# Patient Record
Sex: Male | Born: 1945 | Race: White | Hispanic: No | Marital: Married | Smoking: Light tobacco smoker
Health system: Southern US, Community
[De-identification: ages and names within clinical notes are randomized; demographics above are authoritative.]

## PROBLEM LIST (undated history)

## (undated) DIAGNOSIS — I10 Essential (primary) hypertension: Secondary | ICD-10-CM

## (undated) DIAGNOSIS — E039 Hypothyroidism, unspecified: Secondary | ICD-10-CM

## (undated) DIAGNOSIS — N189 Chronic kidney disease, unspecified: Secondary | ICD-10-CM

## (undated) DIAGNOSIS — J189 Pneumonia, unspecified organism: Secondary | ICD-10-CM

## (undated) DIAGNOSIS — R0602 Shortness of breath: Secondary | ICD-10-CM

## (undated) DIAGNOSIS — I252 Old myocardial infarction: Secondary | ICD-10-CM

## (undated) DIAGNOSIS — I509 Heart failure, unspecified: Secondary | ICD-10-CM

## (undated) DIAGNOSIS — I2510 Atherosclerotic heart disease of native coronary artery without angina pectoris: Secondary | ICD-10-CM

## (undated) DIAGNOSIS — E785 Hyperlipidemia, unspecified: Secondary | ICD-10-CM

## (undated) DIAGNOSIS — K7460 Unspecified cirrhosis of liver: Secondary | ICD-10-CM

## (undated) DIAGNOSIS — I739 Peripheral vascular disease, unspecified: Secondary | ICD-10-CM

## (undated) HISTORY — DX: Chronic kidney disease, unspecified: N18.9

## (undated) HISTORY — DX: Heart failure, unspecified: I50.9

## (undated) HISTORY — DX: Unspecified cirrhosis of liver: K74.60

## (undated) HISTORY — PX: TONSILLECTOMY: SUR1361

---

## 2011-06-24 ENCOUNTER — Inpatient Hospital Stay: Payer: Self-pay | Admitting: Cardiology

## 2011-06-24 HISTORY — PX: CARDIAC CATHETERIZATION: SHX172

## 2011-06-24 LAB — BASIC METABOLIC PANEL
Anion Gap: 11 (ref 7–16)
Co2: 22 mmol/L (ref 21–32)
Creatinine: 1.08 mg/dL (ref 0.60–1.30)
EGFR (Non-African Amer.): >60
Glucose: 127 mg/dL — ABNORMAL HIGH (ref 65–99)
Sodium: 137 mmol/L (ref 136–145)

## 2011-06-24 LAB — CBC WITH DIFFERENTIAL/PLATELET
Basophil #: 0.1 10*3/uL (ref 0.0–0.1)
Basophil %: 1 %
Eosinophil #: 0.1 10*3/uL (ref 0.0–0.7)
Eosinophil %: 1.2 %
HCT: 51.5 % (ref 40.0–52.0)
Lymphocyte %: 23.6 %
MCH: 30.4 pg (ref 26.0–34.0)
Monocyte %: 11.4 %
Neutrophil #: 3.8 10*3/uL (ref 1.4–6.5)
Neutrophil %: 62.8 %
WBC: 6 10*3/uL (ref 3.8–10.6)

## 2011-06-24 LAB — PROTIME-INR: INR: 1.1

## 2011-06-24 LAB — CK TOTAL AND CKMB (NOT AT ARMC)
CK, Total: 53 U/L (ref 35–232)
CK-MB: 0.8 ng/mL (ref 0.5–3.6)

## 2011-06-24 LAB — APTT: Activated PTT: 37.9 secs — ABNORMAL HIGH (ref 23.6–35.9)

## 2011-06-25 LAB — CK TOTAL AND CKMB (NOT AT ARMC)
CK-MB: 0.7 ng/mL (ref 0.5–3.6)
CK-MB: 0.6 ng/mL (ref 0.5–3.6)

## 2011-06-25 LAB — LIPID PANEL
HDL Cholesterol: 26 mg/dL — ABNORMAL LOW (ref 40–60)
Ldl Cholesterol, Calc: 98 mg/dL (ref 0–100)
Triglycerides: 69 mg/dL (ref 0–200)
VLDL Cholesterol, Calc: 14 mg/dL (ref 5–40)

## 2011-06-25 LAB — TROPONIN I: Troponin-I: 0.06 ng/mL — ABNORMAL HIGH

## 2011-06-26 ENCOUNTER — Encounter (HOSPITAL_COMMUNITY): Payer: Self-pay | Admitting: Physician Assistant

## 2011-06-26 ENCOUNTER — Inpatient Hospital Stay (HOSPITAL_COMMUNITY)
Admission: AD | Admit: 2011-06-26 | Discharge: 2011-07-10 | DRG: 235 | Disposition: A | Discharge status: Skilled Nursing Facility | Payer: Medicare HMO | Source: Other Acute Inpatient Hospital | Attending: Thoracic Surgery (Cardiothoracic Vascular Surgery) | Admitting: Thoracic Surgery (Cardiothoracic Vascular Surgery)

## 2011-06-26 ENCOUNTER — Inpatient Hospital Stay (HOSPITAL_COMMUNITY): Payer: Medicare HMO

## 2011-06-26 ENCOUNTER — Other Ambulatory Visit: Payer: Self-pay | Admitting: Thoracic Surgery (Cardiothoracic Vascular Surgery)

## 2011-06-26 DIAGNOSIS — I255 Ischemic cardiomyopathy: Secondary | ICD-10-CM

## 2011-06-26 DIAGNOSIS — I739 Peripheral vascular disease, unspecified: Secondary | ICD-10-CM

## 2011-06-26 DIAGNOSIS — I2510 Atherosclerotic heart disease of native coronary artery without angina pectoris: Secondary | ICD-10-CM

## 2011-06-26 DIAGNOSIS — F17200 Nicotine dependence, unspecified, uncomplicated: Secondary | ICD-10-CM | POA: Diagnosis present

## 2011-06-26 DIAGNOSIS — E039 Hypothyroidism, unspecified: Secondary | ICD-10-CM

## 2011-06-26 DIAGNOSIS — Z720 Tobacco use: Secondary | ICD-10-CM

## 2011-06-26 DIAGNOSIS — I6529 Occlusion and stenosis of unspecified carotid artery: Secondary | ICD-10-CM | POA: Diagnosis present

## 2011-06-26 DIAGNOSIS — I5043 Acute on chronic combined systolic (congestive) and diastolic (congestive) heart failure: Secondary | ICD-10-CM | POA: Diagnosis present

## 2011-06-26 DIAGNOSIS — D62 Acute posthemorrhagic anemia: Secondary | ICD-10-CM | POA: Diagnosis not present

## 2011-06-26 DIAGNOSIS — I2589 Other forms of chronic ischemic heart disease: Secondary | ICD-10-CM | POA: Diagnosis present

## 2011-06-26 DIAGNOSIS — E785 Hyperlipidemia, unspecified: Secondary | ICD-10-CM

## 2011-06-26 DIAGNOSIS — I509 Heart failure, unspecified: Secondary | ICD-10-CM | POA: Diagnosis present

## 2011-06-26 DIAGNOSIS — R0602 Shortness of breath: Secondary | ICD-10-CM

## 2011-06-26 DIAGNOSIS — I10 Essential (primary) hypertension: Secondary | ICD-10-CM

## 2011-06-26 DIAGNOSIS — D696 Thrombocytopenia, unspecified: Secondary | ICD-10-CM | POA: Diagnosis present

## 2011-06-26 DIAGNOSIS — I252 Old myocardial infarction: Secondary | ICD-10-CM

## 2011-06-26 DIAGNOSIS — I779 Disorder of arteries and arterioles, unspecified: Secondary | ICD-10-CM

## 2011-06-26 DIAGNOSIS — N170 Acute kidney failure with tubular necrosis: Secondary | ICD-10-CM | POA: Diagnosis not present

## 2011-06-26 DIAGNOSIS — R5381 Other malaise: Secondary | ICD-10-CM | POA: Diagnosis not present

## 2011-06-26 HISTORY — DX: Hyperlipidemia, unspecified: E78.5

## 2011-06-26 HISTORY — DX: Shortness of breath: R06.02

## 2011-06-26 HISTORY — DX: Hypothyroidism, unspecified: E03.9

## 2011-06-26 HISTORY — DX: Atherosclerotic heart disease of native coronary artery without angina pectoris: I25.10

## 2011-06-26 HISTORY — DX: Old myocardial infarction: I25.2

## 2011-06-26 HISTORY — DX: Pneumonia, unspecified organism: J18.9

## 2011-06-26 HISTORY — DX: Peripheral vascular disease, unspecified: I73.9

## 2011-06-26 HISTORY — DX: Essential (primary) hypertension: I10

## 2011-06-26 LAB — CARDIAC PANEL(CRET KIN+CKTOT+MB+TROPI)
CK, MB: 2.4 ng/mL (ref 0.3–4.0)
CK, MB: 2.3 ng/mL (ref 0.3–4.0)
Total CK: 53 U/L (ref 7–232)
Troponin I: <0.3 ng/mL (ref <0.30)

## 2011-06-26 LAB — HEMOGLOBIN A1C: Mean Plasma Glucose: 123 mg/dL — ABNORMAL HIGH (ref <117)

## 2011-06-26 LAB — BASIC METABOLIC PANEL WITH GFR
Anion Gap: 12
BUN: 14 mg/dL
Calcium, Total: 8.1 mg/dL — ABNORMAL LOW
Chloride: 104 mmol/L
Co2: 21 mmol/L
Creatinine: 1.01 mg/dL
EGFR (African American): >60
EGFR (Non-African Amer.): >60
Glucose: 77 mg/dL
Osmolality: 273
Potassium: 4.1 mmol/L
Sodium: 137 mmol/L

## 2011-06-26 LAB — COMPREHENSIVE METABOLIC PANEL
AST: 19 U/L (ref 0–37)
BUN: 12 mg/dL (ref 6–23)
CO2: 24 mEq/L (ref 19–32)
Calcium: 9.3 mg/dL (ref 8.4–10.5)
Creatinine, Ser: 0.96 mg/dL (ref 0.50–1.35)
GFR calc Af Amer: >90 mL/min (ref >90)
GFR calc non Af Amer: 85 mL/min — ABNORMAL LOW (ref >90)

## 2011-06-26 LAB — HEPARIN LEVEL (UNFRACTIONATED): Heparin Unfractionated: 0.19 IU/mL — ABNORMAL LOW (ref 0.30–0.70)

## 2011-06-26 MED ORDER — LEVOTHYROXINE SODIUM 50 MCG PO TABS
50.0000 ug | ORAL_TABLET | Freq: Every day | ORAL | Status: DC
  Administered 2011-06-27 – 2011-06-29 (×3): 50 ug via ORAL
  Filled 2011-06-26 (×4): qty 1

## 2011-06-26 MED ORDER — LISINOPRIL 5 MG PO TABS
5.0000 mg | ORAL_TABLET | Freq: Every day | ORAL | Status: DC
  Administered 2011-06-27 – 2011-06-28 (×2): 5 mg via ORAL
  Filled 2011-06-26 (×3): qty 1

## 2011-06-26 MED ORDER — ONDANSETRON HCL 4 MG/2ML IJ SOLN: 4.0000 mg | Freq: Four times a day (QID) | INTRAMUSCULAR | Status: DC | PRN

## 2011-06-26 MED ORDER — FUROSEMIDE 40 MG PO TABS
40.0000 mg | ORAL_TABLET | Freq: Every day | ORAL | Status: DC
  Administered 2011-06-27 – 2011-06-28 (×2): 40 mg via ORAL
  Filled 2011-06-26 (×3): qty 1

## 2011-06-26 MED ORDER — SODIUM CHLORIDE 0.9 % IJ SOLN
3.0000 mL | Freq: Two times a day (BID) | INTRAMUSCULAR | Status: DC
  Administered 2011-06-27 – 2011-06-29 (×4): 3 mL via INTRAVENOUS

## 2011-06-26 MED ORDER — SODIUM CHLORIDE 0.9 % IV SOLN: 250.0000 mL | INTRAVENOUS | Status: DC | PRN

## 2011-06-26 MED ORDER — POTASSIUM CHLORIDE CRYS ER 20 MEQ PO TBCR
20.0000 meq | EXTENDED_RELEASE_TABLET | Freq: Every day | ORAL | Status: DC
  Administered 2011-06-27 – 2011-06-29 (×3): 20 meq via ORAL
  Filled 2011-06-26 (×4): qty 1

## 2011-06-26 MED ORDER — HEPARIN (PORCINE) IN NACL 100-0.45 UNIT/ML-% IJ SOLN
1400.0000 [IU]/h | INTRAMUSCULAR | Status: DC
  Administered 2011-06-28 – 2011-06-29 (×3): 1400 [IU]/h via INTRAVENOUS
  Filled 2011-06-26 (×5): qty 250

## 2011-06-26 MED ORDER — CARVEDILOL 6.25 MG PO TABS
6.2500 mg | ORAL_TABLET | Freq: Two times a day (BID) | ORAL | Status: DC
  Administered 2011-06-26 – 2011-06-27 (×3): 6.25 mg via ORAL
  Filled 2011-06-26 (×6): qty 1

## 2011-06-26 MED ORDER — ATORVASTATIN CALCIUM 20 MG PO TABS
20.0000 mg | ORAL_TABLET | Freq: Every day | ORAL | Status: DC
  Administered 2011-06-26 – 2011-07-09 (×13): 20 mg via ORAL
  Filled 2011-06-26 (×15): qty 1

## 2011-06-26 MED ORDER — FUROSEMIDE 40 MG PO TABS
40.0000 mg | ORAL_TABLET | Freq: Every day | ORAL | Status: DC
  Filled 2011-06-26: qty 1

## 2011-06-26 MED ORDER — ASPIRIN EC 81 MG PO TBEC
81.0000 mg | DELAYED_RELEASE_TABLET | Freq: Every day | ORAL | Status: DC
  Administered 2011-06-27 – 2011-06-29 (×3): 81 mg via ORAL
  Filled 2011-06-26 (×4): qty 1

## 2011-06-26 MED ORDER — SODIUM CHLORIDE 0.9 % IJ SOLN: 3.0000 mL | INTRAMUSCULAR | Status: DC | PRN

## 2011-06-26 MED ORDER — ALPRAZOLAM 0.25 MG PO TABS: 0.2500 mg | ORAL_TABLET | Freq: Two times a day (BID) | ORAL | Status: DC | PRN

## 2011-06-26 MED ORDER — POTASSIUM CHLORIDE CRYS ER 20 MEQ PO TBCR: 20.0000 meq | EXTENDED_RELEASE_TABLET | Freq: Every day | ORAL | Status: DC

## 2011-06-26 MED ORDER — NITROGLYCERIN 0.4 MG SL SUBL: 0.4000 mg | SUBLINGUAL_TABLET | SUBLINGUAL | Status: DC | PRN

## 2011-06-26 MED ORDER — ACETAMINOPHEN 325 MG PO TABS: 650.0000 mg | ORAL_TABLET | ORAL | Status: DC | PRN

## 2011-06-26 MED ORDER — ZOLPIDEM TARTRATE 5 MG PO TABS: 5.0000 mg | ORAL_TABLET | Freq: Every evening | ORAL | Status: DC | PRN

## 2011-06-26 NOTE — Consult Note (Signed)
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 Christopher McAlhany/ Dr. Kenneth Fath  [name] [name] is an 66 y.o. male.  HPI: 66 yo WM presents with cc/o shortness of breath and leg swelling.  [name] [name] has noted shortness of breath exertion for about 2 years. He is not a very precise historian.  He rarely sees a doctor, but had no known history of CAD or COPD. He states that when he first noted the SOB it would only be when he was walking up an incline or stairs. About 6-8 weeks ago he noted the SOB would occur with much less exertion and it would take him longer to recover. He finally sought medical attention when he began noticing swelling in his legs. He was started on medications for his blood pressure. He had a stress test which showed anteroapical and inferolateral defects (report not available, info obtained from notes). He then had cardiac catheterization 06/24/11. It showed severe left main and 3 vessel CAD and markedly impaired LV function with an EF of 20- 25%.  He was also found to have a 75- 95% left ICA stenosis by ultrasound. When asked about possible amaurosis fugax he says he has noted some visual difficulty, which usually occurs before breakfast. This does not sound like classic amaurosis, but it is difficult to tell for sure. He does have claudication with calf pain when he walks.  Past Medical History   Diagnosis  Date   .  Hyperlipidemia    .  Hypertension    Hypothyroidism  No past surgical history on file.  Family History   Problem  Relation  Age of Onset   .  Heart disease  Mother  59   .  Hypertension  Brother    .  Arrhythmia  Brother     Social History: reports that he has been smoking Cigarettes. He has a 50 pack-year smoking history. He does not have any smokeless tobacco history on file. He reports that he does not drink alcohol or use illicit drugs.  Allergies: No Known Allergies  Medications:    Prior to Admission:  Prescriptions prior to admission   Medication  Sig  Dispense  Refill   .  atorvastatin (LIPITOR) 20 MG tablet  Take 20 mg by mouth at bedtime.     .  carvedilol (COREG) 6.25 MG tablet  Take 6.25 mg by mouth 2 (two) times daily with a meal.     .  furosemide (LASIX) 40 MG tablet  Take 40 mg by mouth daily.     .  levothyroxine (SYNTHROID, LEVOTHROID) 50 MCG tablet  Take 50 mcg by mouth daily.     .  lisinopril (PRINIVIL,ZESTRIL) 5 MG tablet  Take 5 mg by mouth daily.     .  potassium chloride SA (K-DUR,KLOR-CON) 20 MEQ tablet  Take 20 mEq by mouth daily.      Results for orders placed during the hospital encounter of 06/26/11 (from the past 48 hour(s))   CARDIAC PANEL(CRET KIN+CKTOT+MB+TROPI) Status: Normal (Preliminary result)    Collection Time    06/26/11 4:00 PM   Component  Value  Range  Comment    Total CK  PENDING  7 - 232 (U/L)     CK, MB  2.4  0.3 - 4.0 (ng/mL)     Troponin I  <0.30  <0.30 (ng/mL)     Relative Index  PENDING  0.0 - 2.5       X-ray Chest Pa And Lateral  06/26/2011 *RADIOLOGY REPORT* Clinical Data: Shortness of breath, hypertension, smoker CHEST - 2 VIEW Comparison: None Findings: Enlargement of cardiac silhouette with pulmonary vascular congestion. Minimally tortuous thoracic aorta. No gross failure or consolidation. Tiny left pleural effusion. No pneumothorax or acute osseous findings. IMPRESSION: Enlargement of cardiac silhouette with pulmonary vascular congestion. Tiny left pleural effusion. No acute infiltrate. Original Report Authenticated By: MARK A. BOLES, M.D.   Review of Systems  Constitutional: Positive for malaise/fatigue. Negative for fever and chills.  Respiratory: Positive for cough and shortness of breath. Negative for hemoptysis and wheezing.  Cardiovascular: Positive for orthopnea and claudication. Negative for chest pain.  Gastrointestinal: Negative.  Genitourinary: Negative.  Musculoskeletal: Positive for myalgias.   Neurological:  Nonspecific visual changes while driving, does not seem localized to one eye  Endo/Heme/Allergies: Does not bruise/bleed easily.  All other systems reviewed and are negative.   Blood pressure 139/90, pulse 62, temperature 97.5 F (36.4 C), temperature source Oral, resp. rate 18, height 6' (1.829 m), weight 153 lb (69.4 kg), SpO2 100.00%.  Physical Exam  Vitals reviewed.  Constitutional: He is oriented to person, place, and time. He appears well-developed and well-nourished. No distress.  HENT:  Head: Normocephalic and atraumatic.  Eyes: EOM are normal. Pupils are equal, round, and reactive to light.  Neck: Neck supple. No thyromegaly present.  + carotid bruits bilaterally, right > left  Cardiovascular: Normal rate and regular rhythm. Exam reveals gallop (+ S4).  No murmur heard.  Respiratory: Effort normal. He has no wheezes. He has no rales.  Diminished BS bilaterally  GI: Soft. There is no tenderness.  Lymphadenopathy:  He has no cervical adenopathy.  Neurological: He is alert and oriented to person, place, and time. No cranial nerve deficit.  No focal motor deficits  Skin: Skin is warm and dry.   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 He also has acute on chronic systolic CHF. In addition he has a tight left ICA stenosis and severe iliac disease bilaterally.  CABG would be high risk in his case with a mortality in the 5- 10% range. He also would be at high risk for stroke or other ischemic complications given his diffuse vasculopathy.  I have discussed with the patient the general nature of the CABG, the need for general anesthesia, and incisions that are used. I have discussed the expected hospital stay,  overall recovery and short and long term outcomes. He understands the risks include but are not limited to death, stroke, MI, DVT/PE, bleeding, possible need for transfusion, infections, and other organ system dysfunction including respiratory, renal, or GI complications. He understands he would be a very high risk candidate.  It would be nice to assess myocardial viablity prior to CABG, will discuss feasability with Cardiology.  He will be seen by VVS re: his ICA stenoses  Cary Lothrop C  06/26/2011, 5:14 PM   

## 2011-06-26 NOTE — H&P (Signed)
History and Physical   Patient ID: [name] [name] MRN: [id number], DOB/AGE: Jun 12, 1945   Admit date: 06/26/2011 Date of Consult: 06/26/2011   Primary Physician: No primary provider on file. Primary Cardiologist: Dr. Harold Hedge; Dr. Odella Aquas  The patient's history and HPI was partially supplemented by paper records from [facility].  Pt. Profile: [name] [name] is a 66yo male from [place], [place] with no prior cardiac history, PHMx significant for hyperlipidemia, hypothyroidism who was transferred to [facility] hospital on 06/26/11 from [facility] for cardiothoracic surgery evaluation after underwent elective cardiac catheterization on 06/24/11 revealing severe three-vessel CAD.   Problem List: Past Medical History  Diagnosis Date  . Hyperlipidemia   . Hypertension     No past surgical history on file.   Allergies: No Known Allergies  PAST CARDIAC HISTORY:  CARDIAC CATH 06/24/11: Ostial left main: 80% stenosis; distal left main: 70% stenosis; proximal LAD: 95% stenosis; mid LAD: 95% stenosis; proximal LCx: 99% stenosis; proximal RCA: diffuse 90% stenosis; mid-RCA: 90% stenosis. LV-gram severe anterolateral hypokinesis, apical akinesis and moderate inferolateral hypokinesis. EF 25-30%.   Lexiscan Myoview: inferior and anteroapical ischemia; decreased EF (unquantified per [facility] notes)  HPI:   [name] underwent a Lexiscan Myoview after endorsing increased DOE. Per report, the study elicited evidence of inferior and anteroapical ischemia consistent with a possible ischemic cardiomyopathy. He was subsequently scheduled to undergo elective cardiac catheterization at [facility]. This was performed, and revealed severe three-vessel CAD (see above). He was subsequently admitted, placed on heparin gtt and stabilized prior to planned transfer to [facility] for evaluation of bypass grafting.   Additionally, carotid doppler u/s  revealed L ICA stenosis 75-95%. R ICA 50-75%. He was started on the below outpatient medications including ASA 81mg  daily. An additional discharge diagnosis of PVD was added prior to transfer. This was based on moderate to severe left iliac disease noted on cath. He was assessed, found to be stable, in good condition and transferred to [facility].   The patient reports experiencing leg and ankle swelling approximately 6-8 weeks ago. He saw a cardiologist and vascular specialist. He was placed placed on a diuretic and compression stockings. He was subsequently scheduled for a [employer]. This revealed multiple areas of hypokinesis, ischemia and decreased EF. He was was subsequently scheduled for a cardiac cath.   He states he has never experienced chest pain. He notes increased DOE for over a year. He states he becomes markedly short of breath after one flight of stairs. This has limited his activity level. He denies palpitations, lightheadedness, diaphoresis, n/v. Denies fevers, chills, n/v/d, cough. He denies urinary changes or changes to his bowel movements. Denies active bleeding. He notes occasional orthopnea. Worsening LE edema. Denies PND.   Heparin gtt was continued. The patient is currently comfortable, in NAD.   OSH Labs:  Lipid panel:  TC 138 TG 69 HDL 26 VLDL 14 LDL 98  Cardiac biomarkers: Trop-I: 0.07>0.07 CK-MB: 0.8>0.7 CK; 53>43  CBC: WBC: 6.0 Hgb 16.6 Hct: 51.5 PLT: 168  BMET:  BUN: 13 Cr: 1.08 Na: 137 K: 4.1 Cl: 104 CO2: 22 Ca: 8.5 GFR>60 Glu: 127  Home Medications: Prior to Admission medications   Medication Sig Start Date End Date Taking? Authorizing Provider  atorvastatin (LIPITOR) 20 MG tablet Take 20 mg by mouth at bedtime.   Yes Historical Provider, MD  carvedilol (COREG) 6.25 MG tablet Take 6.25 mg by mouth 2 (two) times daily with a meal.  Yes Historical Provider, MD  furosemide (LASIX) 40 MG tablet Take 40 mg by mouth daily.   Yes Historical  Provider, MD  levothyroxine (SYNTHROID, LEVOTHROID) 50 MCG tablet Take 50 mcg by mouth daily.   Yes Historical Provider, MD  lisinopril (PRINIVIL,ZESTRIL) 5 MG tablet Take 5 mg by mouth daily.   Yes Historical Provider, MD  potassium chloride SA (K-DUR,KLOR-CON) 20 MEQ tablet Take 20 mEq by mouth daily.   Yes Historical Provider, MD    Inpatient Medications:     Prescriptions prior to admission  Medication Sig Dispense Refill  . atorvastatin (LIPITOR) 20 MG tablet Take 20 mg by mouth at bedtime.      . carvedilol (COREG) 6.25 MG tablet Take 6.25 mg by mouth 2 (two) times daily with a meal.      . furosemide (LASIX) 40 MG tablet Take 40 mg by mouth daily.      Marland Kitchen levothyroxine (SYNTHROID, LEVOTHROID) 50 MCG tablet Take 50 mcg by mouth daily.      Marland Kitchen lisinopril (PRINIVIL,ZESTRIL) 5 MG tablet Take 5 mg by mouth daily.      . potassium chloride SA (K-DUR,KLOR-CON) 20 MEQ tablet Take 20 mEq by mouth daily.        Family History  Problem Relation Age of Onset  . Heart disease Mother 2  . Hypertension Brother   . Arrhythmia Brother      History   Social History  . Marital Status: Married    Spouse Name: N/A    Number of Children: 2  . Years of Education: N/A   Occupational History  . Not on file.   Social History Main Topics  . Smoking status: Current Everyday Smoker -- 1.0 packs/day for 50 years    Types: Cigarettes  . Smokeless tobacco: Not on file  . Alcohol Use: No  . Drug Use: No  . Sexually Active: Not on file   Other Topics Concern  . Not on file   Social History Narrative   Lives in [place], [place] alone.      Review of Systems: General:  negative for chills, fever, night sweats or weight changes.  Cardiovascular: positive for LE edema, DOE, orthopnea, shortness of breath, negative for chest pain, palpitations, paroxysmal nocturnal dyspnea  Dermatological: negative for rash Respiratory: negative for cough or wheezing Urologic: negative for hematuria Abdominal:  negative for nausea, vomiting, diarrhea, bright red blood per rectum, melena, or hematemesis Neurologic:  negative for visual changes, syncope, or dizziness All other systems reviewed and are otherwise negative except as noted above.  Physical Exam: Blood pressure 139/90, pulse 62, temperature 97.5 F (36.4 C), temperature source Oral, resp. rate 18, weight 69.4 kg (153 lb), SpO2 98.00%.    General: Well developed, well nourished, in no acute distress. Head: Normocephalic, atraumatic, sclera non-icteric, no xanthomas, nares are without discharge. Neck: Negative for carotid bruits. JVD not elevated. Lungs: Clear bilaterally to auscultation without wheezes, rales, or rhonchi. Breathing is unlabored. Heart: RRR with S1 S2. No murmurs, rubs, or gallops appreciated. Abdomen: Soft, non-tender, non-distended with normoactive bowel sounds. No hepatomegaly. No rebound/guarding. No obvious abdominal masses. Msk:  Strength and tone appears normal for age. Extremities: No clubbing, cyanosis or edema.  Distal pedal pulses are 2+ and equal bilaterally. Neuro: Alert and oriented X 3. Moves all extremities spontaneously. Psych:  Responds to questions appropriately with a normal affect.  Labs:  Pending  Radiology/Studies: No results found.  EKG:   ASSESSMENT AND PLAN:   Mr. Karbowski is  a 66yo male from [place], [place] with no prior cardiac history, PHMx significant for hyperlipidemia, hypothyroidism who was transferred to [facility] on 06/26/11 from [facility] for cardiothoracic surgery evaluation after underwent elective cardiac catheterization on 06/24/11 revealing severe three-vessel CAD.   1. CAD- noted in HPI. The patient has severe 3-vessel CAD including proximal 80% left main disease. He was transferred to [facility] for appropriate evaluation by cardiothoracic surgery for bypass grafting. He was sent on the above cardiac meds and heparin gtt. He is currently  comfortable.   - Admit to telemetry  - Cycle cardiac biomarkers  - TCTS and VVS consults have been made  - Daily BMET, CBC, EKGs  - Will order Hgb A1C  - Continue heparin gtt  - Add ASA 81mg  daily  - Continue BB/statin/NTG SL PRN  - Further recs and management TBD by TCTS and VVS   2. Ischemic cardiomyopathy- EF 25-30% on cath on 06/24/11. Likely ischemic given diffuse critical CAD and WMAs. Euvolemic on exam. No appreciable peripheral edema, JVD, rales/wheezes/rhonchi.   - Continue Lasix, KCl  - Daily weights, strict I/Os  - Heart healthy diet  - Will order CXR  3. Hyperlipidemia- noted in the past. Lipid panel from [facility] reveals LDL < 100.   - Continue statin  4. Hypertension- fairly well-controlled since transfer  - Continue antihypertensives  - Will continue to monitor and uptitrate BB if further BP control needed   5. Carotid artery disease- L>R ICA stenosis as noted in the HPI. Patient denies incoordination, slurred speech, weakness, numbness.   - VVS consult made   6. Tobacco abuse- 50+ pack-year history. Patient willing to quit with assistance.   - Tobacco cessation counseling  7. Hypothyroidism- diagnosed by PCP  - Continue Synthroid  Signed, R. Hurman Horn, PA-C 06/26/2011, 3:11 PM    I have personally seen and examined this patient with Hurman Horn, PA-C. I agree with the assessment and plan as outlined above. Mr. Loiseau is transferred from [facility] under the care of Dr. Lady Gary (Cardiology) who has performed his complete workup. [facility] has not been involved at [facility]. We are asked to assume his care until he can be evaluated by CT surgery and Vascular Surgery. He has been found to have severe triple vessel CAD with left main disease and also high grade LICA stenosis. We will continue current management. We have called CT surgery and VVS to see the pt to plan surgical intervention.   Darling Cieslewicz 4:07 PM 06/26/2011

## 2011-06-26 NOTE — Progress Notes (Addendum)
ANTICOAGULATION CONSULT NOTE - Initial Consult  Pharmacy Consult for Heparin Indication: chest pain/ACS (3 vessel disease)  No Known Allergies  Patient Measurements: Height: 6' (182.9 cm) Weight: 153 lb (69.4 kg) IBW/kg (Calculated) : 77.6  Heparin Dosing Weight: 69.4 kg  Vital Signs: Temp: 97.5 F (36.4 C) (06/07 1400) Temp src: Oral (06/07 1400) BP: 139/90 mmHg (06/07 1400) Pulse Rate: 62  (06/07 1400)  Labs: From [place]  Hgb 16.6, PLTC 168 SCr 1.08, CrCl ~ 60 ml/min  aPTT (on heparin) 69.4    Medical History: Past Medical History  Diagnosis Date  . Hyperlipidemia   . Hypertension     Medications:  Heparin currently infusing at 1040 units/hr.  Pt has been on this rate since 6/5 at [place]  Assessment: 66 yo M transferred to [facility] from  for TCTS eval. Pt had cardiac cath at [facility] 06/24/11 which revealed 3vd  Goal of Therapy:  Heparin level 0.3-0.7 units/ml Monitor platelets by anticoagulation protocol: Yes   Plan:  Confirmed with patient, heparin infusion has continued during transport.  Will check heparin level now and adjust rate if needed.  [employer], Pharm.D., BCPS Clinical Pharmacist Pager [phone] 06/26/2011 4:59 PM   Addendum: Heparin level = 0.19 on 1040 units/hr.  Level is below goal of 0.3-0.7.  Will increase heparin infusion rate to 1300 units/hr.  Will follow up with repeat heparin level in 6 hours.  Will also order daily heparin level and CBC to start 06/27/11.  [employer], Pharm.D., BCPS Clinical Pharmacist Pager [phone] 06/26/2011 6:52 PM

## 2011-06-27 DIAGNOSIS — Z01810 Encounter for preprocedural cardiovascular examination: Secondary | ICD-10-CM

## 2011-06-27 LAB — BASIC METABOLIC PANEL
CO2: 22 mEq/L (ref 19–32)
Chloride: 101 mEq/L (ref 96–112)
Creatinine, Ser: 0.99 mg/dL (ref 0.50–1.35)
Glucose, Bld: 82 mg/dL (ref 70–99)
Sodium: 134 mEq/L — ABNORMAL LOW (ref 135–145)

## 2011-06-27 LAB — CBC
HCT: 42.8 % (ref 39.0–52.0)
MCH: 30.8 pg (ref 26.0–34.0)
MCV: 88.4 fL (ref 78.0–100.0)
RBC: 4.84 MIL/uL (ref 4.22–5.81)
WBC: 6.7 10*3/uL (ref 4.0–10.5)

## 2011-06-27 LAB — CARDIAC PANEL(CRET KIN+CKTOT+MB+TROPI): Total CK: 43 U/L (ref 7–232)

## 2011-06-27 NOTE — Progress Notes (Addendum)
Pre-op Cardiac Surgery  Carotid Findings:   There is evidence bilaterally of internal carotid artery stenosis in the 60-79% range. There is right external carotid artery stenosis, and the left external carotid artery has an atypical waveform. Unable to visualize the right vertebral artery, left vertebral artery demonstrates antegrade flow.   Upper Extremity Right Left  Brachial Pressures 151-Biphasic 151-Monophasic  Radial Waveforms Dampened monophasic Dampened monophasic  Ulnar Waveforms Dampened monophasic Monophasic  Palmar Arch (Allen's Test) Within normal limits Signal obliterates with radial compression, is unaffected by ulnar compression.      Lower  Extremity Right Left  Dorsalis Pedis Unable to insonate Unable to insonate  Anterior Tibial    Posterior Tibial 71-Severely dampened monophasic 56-Severely dampened monophasic  Ankle/Brachial Indices 0.47 0.37     Findings:  Right ABI=0.47, left ABI=0.37, which is indicative of severe disease bilaterally. Refer to tables above for additional data.  06/28/2011 11:21 AM Elpidio Galea, RDMS, RDCS

## 2011-06-27 NOTE — Progress Notes (Signed)
  Patient Name: [name] [name]      SUBJECTIVE: Admitted in transfer [facility] following catheterization demonstrating severe left main disease EF of 20-25%. He also has high-grade left ICA stenosis. Surgical consultation was obtained and plans not clear.  No chest pain  O iV heparin  VVS consult pending  Viability testing recommended    Past Medical History  Diagnosis Date  . Hyperlipidemia   . Hypertension   . Coronary artery disease   . Peripheral vascular disease 06/26/11    "blockages"  . 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 Lady Gary thinks I've had 2 that I didn't know about"  . Shortness of breath 06/26/11    "all the time for a good while"  . Pneumonia 1950-1960's  . Hypothyroidism     PHYSICAL EXAM Filed Vitals:   06/26/11 1400 06/26/11 1603 06/26/11 2100 06/27/11 0500  BP: 139/90  156/66 166/82  Pulse: 62  51 52  Temp: 97.5 F (36.4 C)  97.9 F (36.6 C) 97.5 F (36.4 C)  TempSrc: Oral     Resp: 18  18 20   Height:      Weight:    168 lb 1.6 oz (76.25 kg)  SpO2: 98% 100% 97% 96%   Well developed and nourished in no acute distress HENT normal Neck supple with JVP-flat Bilateral right greater than left carotid bruit Clear Regular rate and rhythm, no murmurs or gallops Abd-soft with active BS No Clubbing cyanosis edema Skin-warm and dry A & Oriented  Grossly normal sensory and motor function   TELEMETRY: Reviewed telemetry pt in nsr  No intake or output data in the 24 hours ending 06/27/11 0849  LABS: Basic Metabolic Panel:  Lab 06/27/11 4540 06/26/11 1711  NA 134* 134*  K 3.9 4.8  CL 101 100  CO2 22 24  GLUCOSE 82 79  BUN 11 12  CREATININE 0.99 0.96  CALCIUM 8.8 9.3  MG -- --  PHOS -- --   Cardiac Enzymes:  Basename 06/26/11 2132 06/26/11 1600  CKTOTAL 53 61  CKMB 2.3 2.4  CKMBINDEX -- --  TROPONINI <0.30 <0.30   CBC:  Lab 06/27/11 0340  WBC 6.7  NEUTROABS --  HGB 14.9  HCT 42.8  MCV 88.4  PLT 162   PROTIME: No  results found for this basename: LABPROT:3,INR:3 in the last 72 hours Liver Function Tests:  Basename 06/26/11 1711  AST 19  ALT 15  ALKPHOS 203*  BILITOT 1.4*  PROT 7.1  ALBUMIN 3.4*   No results found for this basename: LIPASE:2,AMYLASE:2 in the last 72 hours BNP: No components found with this basename: POCBNP:3 D-Dimer: No results found for this basename: DDIMER:2 in the last 72 hours Hemoglobin A1C:  Basename 06/26/11 1711  HGBA1C 5.9*     ASSESSMENT AND PLAN:  Patient Active Hospital Problem List: CAD (coronary artery disease) (06/26/2011)   Ischemic cardiomyopathy (06/26/2011)   Hyperlipidemia (06/26/2011)   Hypertension (06/26/2011)   Carotid artery disease (06/26/2011)   Tobacco abuse (06/26/2011)   Hypothyroidism (06/26/2011)    Continue current medications Vascular surgery consultation pending Viability assessment-will discuss with CT surgery as to whether they would like Thallium or MR   Signed, Sherryl Manges MD  06/27/2011

## 2011-06-27 NOTE — Progress Notes (Signed)
[facility]  Waiting for carotid duplexes to upload to reading system before leaving my opinion.  Appears B ICA stenosis < 80% so likely nothing immediately needed.  Leonides Sake, MD [facility] [place] Office: [phone] Pager: [phone]  06/27/2011, 9:39 PM

## 2011-06-27 NOTE — Progress Notes (Addendum)
ANTICOAGULATION CONSULT NOTE - Follow Up Consult  Pharmacy Consult for heparin Indication: chest pain/ACS  Labs:  Basename 06/27/11 0340 06/26/11 2132 06/26/11 1711 06/26/11 1600  HGB 14.9 -- -- --  HCT 42.8 -- -- --  PLT 162 -- -- --  APTT -- -- -- --  LABPROT -- -- -- --  INR -- -- -- --  HEPARINUNFRC 0.32 -- 0.19* --  CREATININE -- -- 0.96 --  CKTOTAL -- 53 -- 61  CKMB -- 2.3 -- 2.4  TROPONINI -- <0.30 -- <0.30    Assessment/Plan: 66yo male now therapeutic on heparin after rate increase.  Will increase heparin to 1400 units / hr Follow up AM heparin level  Okey Regal, PharmD Clinical Pharmacist [phone] 06/27/2011,4:44 AM

## 2011-06-27 NOTE — Progress Notes (Signed)
No complaints this AM  BP 166/82  Pulse 52  Temp(Src) 97.5 F (36.4 C) (Oral)  Resp 20  Ht 6' (1.829 m)  Wt 168 lb 1.6 oz (76.25 kg)  BMI 22.80 kg/m2  SpO2 96%  Cardiac RRR Lungs diminished BS bilaterally  D/W Dr. Graciela Husbands- he's scheduled for MR on MOnday Plan CABG Tuesday assuming MR shows viability VVS evaluation pending

## 2011-06-28 ENCOUNTER — Inpatient Hospital Stay (HOSPITAL_COMMUNITY): Payer: Medicare HMO

## 2011-06-28 DIAGNOSIS — I6529 Occlusion and stenosis of unspecified carotid artery: Secondary | ICD-10-CM

## 2011-06-28 LAB — BASIC METABOLIC PANEL
BUN: 12 mg/dL (ref 6–23)
CO2: 21 mEq/L (ref 19–32)
Chloride: 102 mEq/L (ref 96–112)
Glucose, Bld: 78 mg/dL (ref 70–99)
Potassium: 4.2 mEq/L (ref 3.5–5.1)

## 2011-06-28 LAB — CBC
HCT: 44.2 % (ref 39.0–52.0)
Hemoglobin: 15.3 g/dL (ref 13.0–17.0)
MCH: 30.8 pg (ref 26.0–34.0)
MCHC: 34.6 g/dL (ref 30.0–36.0)
MCV: 88.9 fL (ref 78.0–100.0)

## 2011-06-28 MED ORDER — IOHEXOL 350 MG/ML SOLN
50.0000 mL | Freq: Once | INTRAVENOUS | Status: AC | PRN
  Administered 2011-06-28: 50 mL via INTRAVENOUS

## 2011-06-28 MED ORDER — CARVEDILOL 12.5 MG PO TABS
12.5000 mg | ORAL_TABLET | Freq: Two times a day (BID) | ORAL | Status: DC
  Administered 2011-06-28 – 2011-06-29 (×3): 12.5 mg via ORAL
  Filled 2011-06-28 (×6): qty 1

## 2011-06-28 NOTE — Progress Notes (Signed)
Patient Name: [name] [name]      SUBJECTIVE: Admitted in transfer [facility] following catheterization demonstrating severe left main disease EF of 20-25%. He also has high-grade left ICA stenosis. Surgical consultation was obtained and plans not clear.  No chest pain  O iV heparin  VVS consult pending  Viability testing >> MRI ordered    Past Medical History  Diagnosis Date  . Hyperlipidemia   . Hypertension   . Coronary artery disease   . Peripheral vascular disease 06/26/11    "blockages"  . 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 Lady Gary thinks I've had 2 that I didn't know about"  . Shortness of breath 06/26/11    "all the time for a good while"  . Pneumonia 1950-1960's  . Hypothyroidism     PHYSICAL EXAM Filed Vitals:   06/27/11 1025 06/27/11 1404 06/27/11 2100 06/28/11 0500  BP: 128/88 139/83 144/86 134/77  Pulse:  58 55 53  Temp:  98.1 F (36.7 C) 98.6 F (37 C) 98 F (36.7 C)  TempSrc:  Oral    Resp:  20 18 18   Height:      Weight:    170 lb 3.1 oz (77.2 kg)  SpO2:  97% 100% 99%   Well developed and nourished in no acute distress HENT normal Neck supple with JVP-flat Bilateral right greater than left carotid bruit Clear Regular rate and rhythm, no murmurs or gallops Abd-soft with active BS No Clubbing cyanosis edema Skin-warm and dry A & Oriented  Grossly normal sensory and motor function   TELEMETRY: Reviewed telemetry pt in nsr   Intake/Output Summary (Last 24 hours) at 06/28/11 0755 Last data filed at 06/28/11 0300  Gross per 24 hour  Intake 1253.85 ml  Output      0 ml  Net 1253.85 ml    LABS: Basic Metabolic Panel:  Lab 06/27/11 4098 06/26/11 1711  NA 134* 134*  K 3.9 4.8  CL 101 100  CO2 22 24  GLUCOSE 82 79  BUN 11 12  CREATININE 0.99 0.96  CALCIUM 8.8 9.3  MG -- --  PHOS -- --   Cardiac Enzymes:  Basename 06/27/11 0852 06/26/11 2132 06/26/11 1600  CKTOTAL 43 53 61  CKMB 2.0 2.3 2.4  CKMBINDEX -- -- --  TROPONINI  <0.30 <0.30 <0.30   CBC:  Lab 06/28/11 0620 06/27/11 0340  WBC 6.5 6.7  NEUTROABS -- --  HGB 15.3 14.9  HCT 44.2 42.8  MCV 88.9 88.4  PLT 170 162   PROTIME: No results found for this basename: LABPROT:3,INR:3 in the last 72 hours Liver Function Tests:  Basename 06/26/11 1711  AST 19  ALT 15  ALKPHOS 203*  BILITOT 1.4*  PROT 7.1  ALBUMIN 3.4*   No results found for this basename: LIPASE:2,AMYLASE:2 in the last 72 hours BNP: No components found with this basename: POCBNP:3 D-Dimer: No results found for this basename: DDIMER:2 in the last 72 hours Hemoglobin A1C:  Basename 06/26/11 1711  HGBA1C 5.9*     ASSESSMENT AND PLAN:  Patient Active Hospital Problem List: CAD (coronary artery disease) (06/26/2011)   Ischemic cardiomyopathy (06/26/2011)   Hyperlipidemia (06/26/2011)   Hypertension (06/26/2011)   Carotid artery disease (06/26/2011)   Tobacco abuse (06/26/2011)   Hypothyroidism (06/26/2011)    Continue current medications, BB and ACE Vascular surgery consultation pending>> ICA stenosis perhaps not so bad Viability assessment-  MR pending Will increase coreg with elevated blood pressure   Signed, Sherryl Manges MD  06/28/2011   

## 2011-06-28 NOTE — Progress Notes (Signed)
ANTICOAGULATION CONSULT NOTE - Follow Up Consult  Pharmacy Consult for heparin Indication: chest pain/ACS  Labs:  Basename 06/28/11 0620 06/27/11 0852 06/27/11 0340 06/26/11 2132 06/26/11 1711 06/26/11 1600  HGB 15.3 -- 14.9 -- -- --  HCT 44.2 -- 42.8 -- -- --  PLT 170 -- 162 -- -- --  APTT -- -- -- -- -- --  LABPROT -- -- -- -- -- --  INR -- -- -- -- -- --  HEPARINUNFRC 0.42 -- 0.32 -- 0.19* --  CREATININE 1.01 -- 0.99 -- 0.96 --  CKTOTAL -- 43 -- 53 -- 61  CKMB -- 2.0 -- 2.3 -- 2.4  TROPONINI -- <0.30 -- <0.30 -- <0.30    Assessment/Plan: CABG Tuesday Heparin level therapeutic  1) Continue heparin at 1400 units / hr 2) Follow AM level  Okey Regal, PharmD Clinical Pharmacist [phone] 06/28/2011,9:17 AM

## 2011-06-28 NOTE — Consult Note (Addendum)
[facility] [place]  New Carotid Patient  Referred by:  Dr. Clifton Madden  Reason for referral: L carotid stenosis  History of Present Illness  [name] [name] is a 66 y.o. (09/05/1945) male who presents with chief complaint: heart disease.  This pt was transferred from outside hospital with 3 vessel coronary artery disease.  By report he has a high grade stenosis in his LICA.  He has a distant history which may be consistent with prior stroke on L: L facial numbness and expressive aphasia which resolved after a few weeks.  The patient has never had amaurosis fugax or monocular blindness.  The patient has never had facial drooping or hemiplegia.  The patient has had receptive or expressive aphasia as noted.   The patient's previous neurologic deficits have resolved.  The patient's risks factors for carotid disease include: HTN, hyperlipdemia, and former smoker.  Past Medical History  Diagnosis Date  . Hyperlipidemia   . Hypertension   . Coronary artery disease   . Peripheral vascular disease 06/26/11    "blockages"  . 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 Lady Gary thinks I've had 2 that I didn't know about"  . Shortness of breath 06/26/11    "all the time for a good while"  . Pneumonia 1950-1960's  . Hypothyroidism     Past Surgical History  Procedure Date  . Cardiac catheterization 06/24/11  . Tonsillectomy     "as a kid"    History   Social History  . Marital Status: Married    Spouse Name: N/A    Number of Children: 2  . Years of Education: N/A   Occupational History  . Not on file.   Social History Main Topics  . Smoking status: Former Smoker -- 1.0 packs/day for 50 years    Types: Cigarettes    Quit date: 06/23/2011  . Smokeless tobacco: Not on file  . Alcohol Use: Yes     06/26/11 "every once in awhile I'd drink a beer; that's about come to an end too"  . Drug Use: No  . Sexually Active: Not Currently   Other Topics Concern  . Not on file    Social History Narrative   Lives in [place], [place] alone.     Family History  Problem Relation Age of Onset  . Heart disease Mother 19  . Hypertension Brother   . Arrhythmia Brother     No current facility-administered medications on file prior to encounter.   Current Outpatient Prescriptions on File Prior to Encounter  Medication Sig Dispense Refill  . atorvastatin (LIPITOR) 20 MG tablet Take 20 mg by mouth at bedtime.      . carvedilol (COREG) 6.25 MG tablet Take 6.25 mg by mouth 2 (two) times daily with a meal.      . furosemide (LASIX) 40 MG tablet Take 40 mg by mouth daily.      Marland Kitchen levothyroxine (SYNTHROID, LEVOTHROID) 50 MCG tablet Take 50 mcg by mouth daily.      Marland Kitchen lisinopril (PRINIVIL,ZESTRIL) 5 MG tablet Take 5 mg by mouth daily.      . potassium chloride SA (K-DUR,KLOR-CON) 20 MEQ tablet Take 20 mEq by mouth daily.        No Known Allergies  REVIEW OF SYSTEMS:  (Positives checked otherwise negative)  CARDIOVASCULAR: [ ]  chest pain    [ ]  chest pressure    [ ]  palpitations    [x]  orthopnea   [x]  dyspnea on  exert. [ ]  claudication    [ ]  rest pain     [ ]  DVT     [x]  edema  PULMONARY:    [ ]  productive cough [ ]  asthma  [ ]  wheezing  NEUROLOGIC:    [ ]  weakness    [ ]  paresthesias   [ ]  aphasia    [ ]  amaurosis    [ ]  dizziness  HEMATOLOGIC:    [ ]  bleeding problems  [ ]  clotting disorders  MUSCULOSKEL: [ ]  joint pain     [ ]  joint swelling  GASTROINTEST:  [ ]   blood in stool   [ ]   hematemesis  GENITOURINARY:   [ ]   dysuria    [ ]   hematuria  PSYCHIATRIC:   [ ]  history of major depression  INTEGUMENTARY: [ ]  rashes    [ ]  ulcers  CONSTITUTIONAL:  [ ]  fever     [ ]  chills  Physical Examination  Filed Vitals:   06/27/11 1025 06/27/11 1404 06/27/11 2100 06/28/11 0500  BP: 128/88 139/83 144/86 134/77  Pulse:  58 55 53  Temp:  98.1 F (36.7 C) 98.6 F (37 C) 98 F (36.7 C)  TempSrc:  Oral    Resp:  20 18 18   Height:      Weight:    170 lb 3.1 oz (77.2  kg)  SpO2:  97% 100% 99%   Body mass index is 23.08 kg/(m^2).  General: A&O x 3, WDWN  Head: [place]/AT  Ear/Nose/Throat: Hearing grossly intact, nares w/o erythema or drainage, oropharynx w/o Erythema/Exudate  Eyes: PERRLA, EOMI  Neck: Supple, no nuchal rigidity, no palpable LAD  Pulmonary: Sym exp, good air movt, CTAB, no rales, rhonchi, & wheezing  Cardiac: RRR, Nl S1, S2, no Murmurs, rubs or gallops  Vascular: Vessel Right Left  Radial Palpable Palpable  Brachial Palpable Palpable  Carotid Palpable, without bruit Palpable, without bruit  Aorta Non-palpable N/A  Femoral Palpable Palpable  Popliteal Non-palpable Non-palpable  PT Non-Palpable Non-Palpable  DP Non-Palpable Non-Palpable   Gastrointestinal: soft, NTND, -G/R, - HSM, - masses, - CVAT B  Musculoskeletal: M/S 5/5 throughout , Extremities without ischemic changes   Neurologic: CN 2-12 intact , Pain and light touch intact in extremities , Motor exam as listed above  Psychiatric: Judgment intact, Mood & affect appropriate for pt's clinical situation  Dermatologic: See M/S exam for extremity exam, no rashes otherwise noted  Lymph : No Cervical, Axillary, or Inguinal lymphadenopathy   Laboratory: CBC:    Component Value Date/Time   WBC 6.5 06/28/2011 0620   RBC 4.97 06/28/2011 0620   HGB 15.3 06/28/2011 0620   HCT 44.2 06/28/2011 0620   PLT 170 06/28/2011 0620   MCV 88.9 06/28/2011 0620   MCH 30.8 06/28/2011 0620   MCHC 34.6 06/28/2011 0620   RDW 15.9* 06/28/2011 0620    BMP:    Component Value Date/Time   NA 134* 06/28/2011 0620   K 4.2 06/28/2011 0620   CL 102 06/28/2011 0620   CO2 21 06/28/2011 0620   GLUCOSE 78 06/28/2011 0620   BUN 12 06/28/2011 0620   CREATININE 1.01 06/28/2011 0620   CALCIUM 9.1 06/28/2011 0620   GFRNONAA 76* 06/28/2011 0620   GFRAA 88* 06/28/2011 0620    Coagulation: No results found for this basename: INR, PROTIME   No results found for this basename: PTT   X-ray Chest Pa And Lateral  06/26/2011   *RADIOLOGY REPORT*  Clinical Data: Shortness of breath,  hypertension, smoker  CHEST - 2 VIEW  Comparison: None  Findings: Enlargement of cardiac silhouette with pulmonary vascular congestion. Minimally tortuous thoracic aorta. No gross failure or consolidation. Tiny left pleural effusion. No pneumothorax or acute osseous findings.  IMPRESSION: Enlargement of cardiac silhouette with pulmonary vascular congestion. Tiny left pleural effusion. No acute infiltrate.  Original Report Authenticated By: Lollie Marrow, M.D.    Non-Invasive Vascular Imaging  CAROTID DUPLEX pending  Medical Decision Making  Shelvy Perazzo Stanbery is a 66 y.o. male who presents with: B ICA , history of high grade L ICA stenosis.   I am awaiting the carotid duplex upload so I make the final reads.  Preliminary results appears that both ICA to be in the 60-79% range.  Given there is a discrepancy between the two doppler studies, a CTA Neck is recommended as an additional study.  As long as the LICA stenosis is <80%, I don't recommend proceeding with a L CEA as this pt is asx.  I discussed in depth with the patient the nature of atherosclerosis, and emphasized the importance of maximal medical management including strict control of blood pressure, blood glucose, and lipid levels, obtaining regular exercise, antiplatelet agents, and cessation of smoking.  The patient is aware that without maximal medical management the underlying atherosclerotic disease process will progress, limiting the benefit of any interventions.  Thank you for allowing [place] to participate in this patient's care.  Leonides Sake, MD [facility] [place] Office: [phone] Pager: [phone]  06/28/2011, 8:30 AM   Addendum, BICA stenosis 60-79% on the mid to lower end of velocities.  Awaiting CTA Neck results.  Doubt a combined CABG-CEA will be needed.  Leonides Sake, MD [facility] [place] Office:  [phone] Pager: [phone]  06/28/2011, 2:49 PM

## 2011-06-28 NOTE — Progress Notes (Signed)
Pt routine am EKG read ACUTE MI/STEMI.  Pt denies any complaints.  There is no ST elevation present.  NT repositioned leads and redid EKG with no ACUTE MI reading present; it reads nonspecific st wave abnormality - no acute changes when compared with previous EKG.  Will continue to monitor.

## 2011-06-29 ENCOUNTER — Inpatient Hospital Stay (HOSPITAL_COMMUNITY): Payer: Medicare HMO

## 2011-06-29 DIAGNOSIS — I2510 Atherosclerotic heart disease of native coronary artery without angina pectoris: Principal | ICD-10-CM

## 2011-06-29 DIAGNOSIS — I2589 Other forms of chronic ischemic heart disease: Secondary | ICD-10-CM

## 2011-06-29 LAB — CBC
MCH: 30.3 pg (ref 26.0–34.0)
MCHC: 33.7 g/dL (ref 30.0–36.0)
MCV: 89.8 fL (ref 78.0–100.0)
Platelets: 166 10*3/uL (ref 150–400)
RBC: 4.92 MIL/uL (ref 4.22–5.81)

## 2011-06-29 LAB — BLOOD GAS, ARTERIAL
Acid-base deficit: 0.3 mmol/L (ref 0.0–2.0)
Bicarbonate: 23.6 mEq/L (ref 20.0–24.0)
Patient temperature: 98.6
TCO2: 24.8 mmol/L (ref 0–100)

## 2011-06-29 LAB — URINALYSIS, ROUTINE W REFLEX MICROSCOPIC
Bilirubin Urine: NEGATIVE
Glucose, UA: NEGATIVE mg/dL
Ketones, ur: NEGATIVE mg/dL
Leukocytes, UA: NEGATIVE
Specific Gravity, Urine: 1.02 (ref 1.005–1.030)
pH: 5 (ref 5.0–8.0)

## 2011-06-29 LAB — URINE MICROSCOPIC-ADD ON

## 2011-06-29 LAB — PULMONARY FUNCTION TEST

## 2011-06-29 LAB — BASIC METABOLIC PANEL
BUN: 13 mg/dL (ref 6–23)
CO2: 21 mEq/L (ref 19–32)
Calcium: 8.7 mg/dL (ref 8.4–10.5)
Creatinine, Ser: 1.03 mg/dL (ref 0.50–1.35)
Glucose, Bld: 93 mg/dL (ref 70–99)
Sodium: 133 mEq/L — ABNORMAL LOW (ref 135–145)

## 2011-06-29 MED ORDER — POTASSIUM CHLORIDE 2 MEQ/ML IV SOLN
80.0000 meq | INTRAVENOUS | Status: DC
  Filled 2011-06-29: qty 40

## 2011-06-29 MED ORDER — SODIUM CHLORIDE 0.9 % IV SOLN
0.1000 ug/kg/h | INTRAVENOUS | Status: DC
  Filled 2011-06-29: qty 4

## 2011-06-29 MED ORDER — INSULIN REGULAR HUMAN 100 UNIT/ML IJ SOLN
INTRAMUSCULAR | Status: DC
  Filled 2011-06-29: qty 1

## 2011-06-29 MED ORDER — TRANEXAMIC ACID 100 MG/ML IV SOLN
1.5000 mg/kg/h | INTRAVENOUS | Status: DC
  Filled 2011-06-29: qty 25

## 2011-06-29 MED ORDER — CEFUROXIME SODIUM 750 MG IJ SOLR
750.0000 mg | INTRAMUSCULAR | Status: DC
  Filled 2011-06-29: qty 750

## 2011-06-29 MED ORDER — DEXTROSE 5 % IV SOLN
1.5000 g | INTRAVENOUS | Status: AC
  Administered 2011-06-30: .75 g via INTRAVENOUS
  Administered 2011-06-30: 1.5 g via INTRAVENOUS
  Filled 2011-06-29: qty 1.5

## 2011-06-29 MED ORDER — EPINEPHRINE HCL 1 MG/ML IJ SOLN
0.5000 ug/min | INTRAVENOUS | Status: DC
  Filled 2011-06-29: qty 4

## 2011-06-29 MED ORDER — SODIUM BICARBONATE 8.4 % IV SOLN
INTRAVENOUS | Status: AC
  Administered 2011-06-30: 10:00:00
  Filled 2011-06-29: qty 2.5

## 2011-06-29 MED ORDER — BISACODYL 5 MG PO TBEC
5.0000 mg | DELAYED_RELEASE_TABLET | Freq: Once | ORAL | Status: DC
  Filled 2011-06-29: qty 1

## 2011-06-29 MED ORDER — CHLORHEXIDINE GLUCONATE 4 % EX LIQD
60.0000 mL | Freq: Once | CUTANEOUS | Status: AC
  Administered 2011-06-30: 4 via TOPICAL
  Filled 2011-06-29: qty 60

## 2011-06-29 MED ORDER — DEXTROSE 5 % IV SOLN
30.0000 ug/min | INTRAVENOUS | Status: DC
  Filled 2011-06-29: qty 2

## 2011-06-29 MED ORDER — NITROGLYCERIN IN D5W 200-5 MCG/ML-% IV SOLN
2.0000 ug/min | INTRAVENOUS | Status: DC
  Filled 2011-06-29: qty 250

## 2011-06-29 MED ORDER — CHLORHEXIDINE GLUCONATE 4 % EX LIQD
Freq: Once | CUTANEOUS | Status: AC
  Administered 2011-06-29: 21:00:00 via TOPICAL

## 2011-06-29 MED ORDER — VANCOMYCIN HCL 1000 MG IV SOLR
1250.0000 mg | INTRAVENOUS | Status: AC
  Administered 2011-06-30: 1250 mg via INTRAVENOUS
  Filled 2011-06-29: qty 1250

## 2011-06-29 MED ORDER — FUROSEMIDE 40 MG PO TABS
40.0000 mg | ORAL_TABLET | Freq: Two times a day (BID) | ORAL | Status: DC
  Administered 2011-06-29 (×2): 40 mg via ORAL
  Filled 2011-06-29 (×4): qty 1

## 2011-06-29 MED ORDER — TRANEXAMIC ACID (OHS) BOLUS VIA INFUSION
15.0000 mg/kg | INTRAVENOUS | Status: DC
  Filled 2011-06-29: qty 1143

## 2011-06-29 MED ORDER — TEMAZEPAM 15 MG PO CAPS: 15.0000 mg | ORAL_CAPSULE | Freq: Once | ORAL | Status: AC | PRN

## 2011-06-29 MED ORDER — GADOBENATE DIMEGLUMINE 529 MG/ML IV SOLN
20.0000 mL | Freq: Once | INTRAVENOUS | Status: AC
  Administered 2011-06-29: 20 mL via INTRAVENOUS

## 2011-06-29 MED ORDER — LISINOPRIL 10 MG PO TABS
10.0000 mg | ORAL_TABLET | Freq: Every day | ORAL | Status: DC
  Administered 2011-06-29: 10 mg via ORAL
  Filled 2011-06-29 (×2): qty 1

## 2011-06-29 MED ORDER — METOPROLOL TARTRATE 12.5 MG HALF TABLET
12.5000 mg | ORAL_TABLET | Freq: Once | ORAL | Status: DC
  Filled 2011-06-29: qty 1

## 2011-06-29 MED ORDER — MAGNESIUM SULFATE 50 % IJ SOLN
40.0000 meq | INTRAMUSCULAR | Status: DC
  Filled 2011-06-29: qty 10

## 2011-06-29 MED ORDER — DIAZEPAM 5 MG PO TABS
5.0000 mg | ORAL_TABLET | Freq: Once | ORAL | Status: AC
  Administered 2011-06-30: 5 mg via ORAL
  Filled 2011-06-29: qty 1

## 2011-06-29 MED ORDER — ALPRAZOLAM 0.25 MG PO TABS
0.2500 mg | ORAL_TABLET | ORAL | Status: DC | PRN
  Administered 2011-07-03 – 2011-07-09 (×2): 0.25 mg via ORAL
  Filled 2011-06-29 (×2): qty 1

## 2011-06-29 MED ORDER — DOPAMINE-DEXTROSE 3.2-5 MG/ML-% IV SOLN
2.0000 ug/kg/min | INTRAVENOUS | Status: DC
  Filled 2011-06-29: qty 250

## 2011-06-29 MED ORDER — ALBUTEROL SULFATE (5 MG/ML) 0.5% IN NEBU
2.5000 mg | INHALATION_SOLUTION | Freq: Once | RESPIRATORY_TRACT | Status: AC
  Administered 2011-06-29: 2.5 mg via RESPIRATORY_TRACT

## 2011-06-29 MED ORDER — TRANEXAMIC ACID (OHS) PUMP PRIME SOLUTION
2.0000 mg/kg | INTRAVENOUS | Status: DC
  Filled 2011-06-29: qty 1.52

## 2011-06-29 NOTE — Progress Notes (Signed)
PFT performed. Unconfirmed results will be placed in Progress Notes of [facility].

## 2011-06-29 NOTE — Consult Note (Signed)
Pharmacy Consult-Anticoagulation  66 y/o male currently on heparin protocol for a diagnosis of Chest Pain, ACS.  Current Labs: Hematology Lab Results  Component Value Date/Time   WBC 6.0 06/29/2011  5:35 AM   RBC 4.92 06/29/2011  5:35 AM   HGB 14.9 06/29/2011  5:35 AM   HCT 44.2 06/29/2011  5:35 AM   MCV 89.8 06/29/2011  5:35 AM   MCH 30.3 06/29/2011  5:35 AM   Platelets  Date Value Range Status  06/29/2011 166  150-400 (K/uL) Final   . Assessment  Heparin infusing at 1400 units/hr.  Heparin level therapeutic at 0.44. Goal Heparin level 0.3 - 0.7  WBC/Hgb/Hct/Plts:  6.0/14.9/44.2/166 (06/10 0535)  No bleeding noted.  Patient is stable with regards to anticoagulation.  Plan   Continue Heparin at current rate. Daily Heparin level and CBC while on Heparin.  Follow up CABG.  [name] [name], Elisha Headland, Pharm.D. 06/29/2011 9:42 AM

## 2011-06-29 NOTE — Progress Notes (Signed)
  Subjective: Feels well, no complaints today  Objective: Vital signs in last 24 hours: Temp:  [97.2 F (36.2 C)-97.6 F (36.4 C)] 97.6 F (36.4 C) (06/10 1437) Pulse Rate:  [51-57] 56  (06/10 1437) Cardiac Rhythm:  [-] Normal sinus rhythm;Heart block (06/10 0744) Resp:  [16-18] 16  (06/10 1437) BP: (138-184)/(48-80) 149/63 mmHg (06/10 1437) SpO2:  [96 %-99 %] 99 % (06/10 1437) Weight:  [167 lb 15.9 oz (76.2 kg)] 167 lb 15.9 oz (76.2 kg) (06/10 0500)  Hemodynamic parameters for last 24 hours:    Intake/Output from previous day: 06/09 0701 - 06/10 0700 In: 707.9 [P.O.:480; I.V.:227.9] Out: -  Intake/Output this shift: Total I/O In: 300 [P.O.:300] Out: -   General appearance: alert and no distress Heart: regular rate and rhythm Lungs: diminished breath sounds bibasilar Extremities: edema 1+  Lab Results:  Basename 06/29/11 0535 06/28/11 0620  WBC 6.0 6.5  HGB 14.9 15.3  HCT 44.2 44.2  PLT 166 170   BMET:  Basename 06/29/11 0535 06/28/11 0620  NA 133* 134*  K 3.7 4.2  CL 102 102  CO2 21 21  GLUCOSE 93 78  BUN 13 12  CREATININE 1.03 1.01  CALCIUM 8.7 9.1    PT/INR: No results found for this basename: LABPROT,INR in the last 72 hours ABG No results found for this basename: phart, pco2, po2, hco3, tco2, acidbasedef, o2sat   CBG (last 3)  No results found for this basename: GLUCAP:3 in the last 72 hours  Assessment/Plan: S/P   For CABG in AM Moderate stenoses BICA- appreciate Dr. Nicky Pugh assistance- no intervention needed at this time  MRI showed majority of compromised myocardium is potentially viable. Some full thickness scar, primarily at the apex. Based on these results CABG is best treatment option, albeit still relatively high risk.  I have discussed with the patient the general nature of the procedure, need for general anesthesia, and incisions to be used. I have discussed the expected hospital stay, overall recovery and short and long term  outcomes. He understands the risks include but are not limited to death, stroke, MI, DVT/PE, bleeding, possible need for transfusion, infections, and other organ system dysfunction including respiratory, renal, or GI complications. He accepts these risks and agrees to proceed.  For CABG in AM Orders writen D/C heparin gtt at 0400   LOS: 3 days    Hamdi Vari C 06/29/2011

## 2011-06-29 NOTE — Progress Notes (Signed)
[facility] [place]  Daily Progress Note  Assessment/Planning: Asx BICA stenosis ~70%   No surgical intervention needed, maximal medical management recommended  Follow up in the office [phone])  Thank you for the consultation!  Subjective    No complaints  Objective Filed Vitals:   06/28/11 1710 06/28/11 1900 06/28/11 2100 06/29/11 0500  BP: 184/80 138/48 148/58 167/52  Pulse: 57  53 51  Temp:   97.2 F (36.2 C) 97.4 F (36.3 C)  TempSrc:      Resp:   18 18  Height:      Weight:    167 lb 15.9 oz (76.2 kg)  SpO2:   98% 96%    Intake/Output Summary (Last 24 hours) at 06/29/11 1033 Last data filed at 06/28/11 1904  Gross per 24 hour  Intake 704.93 ml  Output      0 ml  Net 704.93 ml    PULM  CTAB CV  RRR GI  soft, NTND NEURO exam unchanged, completely intact  Laboratory CBC    Component Value Date/Time   WBC 6.0 06/29/2011 0535   HGB 14.9 06/29/2011 0535   HCT 44.2 06/29/2011 0535   PLT 166 06/29/2011 0535    BMET    Component Value Date/Time   NA 133* 06/29/2011 0535   K 3.7 06/29/2011 0535   CL 102 06/29/2011 0535   CO2 21 06/29/2011 0535   GLUCOSE 93 06/29/2011 0535   BUN 13 06/29/2011 0535   CREATININE 1.03 06/29/2011 0535   CALCIUM 8.7 06/29/2011 0535   GFRNONAA 74* 06/29/2011 0535   GFRAA 86* 06/29/2011 0535   Ct Angio Neck W/cm &/or Wo/cm  06/28/2011   *RADIOLOGY REPORT*  Clinical Data:  Possible high-grade stenosis left internal carotid artery.  CT ANGIOGRAPHY NECK  Technique:  Multidetector CT imaging of the neck was performed using the standard protocol during bolus administration of intravenous contrast.  Multiplanar CT image reconstructions including MIPs were obtained to evaluate the vascular anatomy. Carotid stenosis measurements (when applicable) are obtained utilizing NASCET criteria, using the distal internal carotid diameter as the denominator.  Contrast: 50mL OMNIPAQUE IOHEXOL 350 MG/ML SOLN  Comparison:   None.   Findings:  Nonstenotic calcific atheromatous change of the transverse arch, innominate, and left common carotid artery.  The left subclavian proximally contains calcified and noncalcified plaque with estimated 50% stenosis. Estimated 50% stenosis proximal right subclavian.  Mild nonstenotic irregularity distal right common carotid artery with calcification.  Heavily calcified right carotid bifurcation with estimated 70% stenosis (1.5/5.2) proximal right internal carotid artery.  No dissection or fibromuscular dysplasia. Moderate atheromatous change right external carotid origin.  Widespread atheromatous plaque throughout the distal left common carotid artery, without measurable stenosis.  There is heavily calcified plaque at the origin of the left internal carotid artery with a 70% stenosis (1.6/5.2)  approximately 2 cm above its origin. There is no cervical ICA dissection or fibromuscular disease.  The left vertebral is the sole contributor to the posterior circulation, originates directly from the arch and and there is moderate proximal calcification without flow reducing lesion.  The right vertebral is poorly visualized, smaller than the left, reconstituted only through muscular collaterals but is not a continuous vessel nor does it basically contribute to the basilar artery.  It may supply rudimentary flow to the right P ICA.  Moderate cervical spondylosis.  Scattered nonpathologic lymph nodes in the neck.  Moderate sized bilateral pleural effusions.  Upper mediastinal adenopathy is nonspecific but could relate  to congestive heart failure.  Consider chest CT follow-up 3 months.   Review of the MIP images confirms the above findings.  IMPRESSION:  Bilateral 70% internal carotid artery stenoses, heavily calcified.  Suspected 50% stenosis right and left subclavian arteries.  The left vertebral is the sole contributor to the basilar.  Nonspecific mediastinal adenopathy with bilateral pleural effusions, possibly  component of congestive heart failure; consider chest CT follow-up in 3 months.  Original Report Authenticated By: Elsie Stain, M.D.    Leonides Sake, MD [facility] [place] Office: [phone] Pager: [phone]  06/29/2011, 10:33 AM

## 2011-06-29 NOTE — Progress Notes (Signed)
    SUBJECTIVE: No chest pain or SOB.   BP 167/52  Pulse 51  Temp(Src) 97.4 F (36.3 C) (Oral)  Resp 18  Ht 6' (1.829 m)  Wt 167 lb 15.9 oz (76.2 kg)  BMI 22.78 kg/m2  SpO2 96%  Intake/Output Summary (Last 24 hours) at 06/29/11 0753 Last data filed at 06/28/11 1904  Gross per 24 hour  Intake 707.93 ml  Output      0 ml  Net 707.93 ml    PHYSICAL EXAM General: Well developed, well nourished, in no acute distress. Alert and oriented x 3.  Psych:  Good affect, responds appropriately Neck: No JVD. No masses noted.  Lungs: Clear bilaterally with no wheezes or rhonci noted.  Heart: RRR with no murmurs noted. Abdomen: Bowel sounds are present. Soft, non-tender.  Extremities: 1+ bilateral lower ext edema.   LABS: Basic Metabolic Panel:  Basename 06/29/11 0535 06/28/11 0620  NA 133* 134*  K 3.7 4.2  CL 102 102  CO2 21 21  GLUCOSE 93 78  BUN 13 12  CREATININE 1.03 1.01  CALCIUM 8.7 9.1  MG -- --  PHOS -- --   CBC:  Basename 06/29/11 0535 06/28/11 0620  WBC 6.0 6.5  NEUTROABS -- --  HGB 14.9 15.3  HCT 44.2 44.2  MCV 89.8 88.9  PLT 166 170   Cardiac Enzymes:  Basename 06/27/11 0852 06/26/11 2132 06/26/11 1600  CKTOTAL 43 53 61  CKMB 2.0 2.3 2.4  CKMBINDEX -- -- --  TROPONINI <0.30 <0.30 <0.30    Current Meds:    . aspirin EC  81 mg Oral Daily  . atorvastatin  20 mg Oral QHS  . carvedilol  12.5 mg Oral BID WC  . furosemide  40 mg Oral Daily  . levothyroxine  50 mcg Oral Daily  . lisinopril  5 mg Oral Daily  . potassium chloride SA  20 mEq Oral Daily  . sodium chloride  3 mL Intravenous Q12H  . DISCONTD: carvedilol  6.25 mg Oral BID WC     ASSESSMENT AND PLAN: Mr. Hayashi is a 66yo male from [place], [place] with no prior cardiac history, PHMx significant for hyperlipidemia, hypothyroidism who was transferred to [facility] hospital on 06/26/11 from [facility] for cardiothoracic surgery evaluation after underwent elective cardiac  catheterization on 06/24/11 revealing severe three-vessel CAD with left main disease. He is also noted to have at least moderate bilateral carotid artery stenosis.   1. CAD-Severe triple vessel CAD. He has been seen by Dr. Dorris Fetch with CT surgery. MRI for viability today. If there is viability, then will proceed with CABG likely tomorrow. PFTs this am.   2. Ischemic cardiomyopathy- EF 25-30% on cath on 06/24/11. Likely ischemic given diffuse critical CAD and WMAs. Continue current meds.   3. Carotid artery disease: Awaiting final word from [facility]. Appears to be moderate bilateral ICA stenosis.   4. Hyperlipidemia- Continue statin.   5. Hypertension- Still not controlled. Will titrate Lisinopril.   6. Tobacco abuse- 50+ pack-year history. Patient willing to quit with assistance.  Tobacco cessation counseling   7. Hypothyroidism- Continue Synthroid    Christiaan Strebeck  6/10/20137:53 AM

## 2011-06-30 ENCOUNTER — Encounter (HOSPITAL_COMMUNITY): Payer: Self-pay | Admitting: Anesthesiology

## 2011-06-30 ENCOUNTER — Ambulatory Visit (HOSPITAL_COMMUNITY)
Admission: RE | Admit: 2011-06-30 | Payer: Medicare HMO | Source: Ambulatory Visit | Admitting: Thoracic Surgery (Cardiothoracic Vascular Surgery)

## 2011-06-30 ENCOUNTER — Inpatient Hospital Stay (HOSPITAL_COMMUNITY): Payer: Medicare HMO

## 2011-06-30 ENCOUNTER — Inpatient Hospital Stay (HOSPITAL_COMMUNITY): Payer: Medicare HMO | Admitting: Anesthesiology

## 2011-06-30 ENCOUNTER — Other Ambulatory Visit (HOSPITAL_COMMUNITY): Payer: Medicare HMO

## 2011-06-30 ENCOUNTER — Encounter (HOSPITAL_COMMUNITY)
Admission: AD | Disposition: A | Discharge status: Skilled Nursing Facility | Payer: Self-pay | Source: Other Acute Inpatient Hospital | Attending: Thoracic Surgery (Cardiothoracic Vascular Surgery)

## 2011-06-30 DIAGNOSIS — I2510 Atherosclerotic heart disease of native coronary artery without angina pectoris: Secondary | ICD-10-CM

## 2011-06-30 HISTORY — PX: CORONARY ARTERY BYPASS GRAFT: SHX141

## 2011-06-30 LAB — POCT I-STAT 4, (NA,K, GLUC, HGB,HCT)
Glucose, Bld: 91 mg/dL (ref 70–99)
Glucose, Bld: 104 mg/dL — ABNORMAL HIGH (ref 70–99)
HCT: 44 % (ref 39.0–52.0)
HCT: 33 % — ABNORMAL LOW (ref 39.0–52.0)
HCT: 29 % — ABNORMAL LOW (ref 39.0–52.0)
Hemoglobin: 15 g/dL (ref 13.0–17.0)
Hemoglobin: 11.2 g/dL — ABNORMAL LOW (ref 13.0–17.0)
Hemoglobin: 14.3 g/dL (ref 13.0–17.0)
Hemoglobin: 9.9 g/dL — ABNORMAL LOW (ref 13.0–17.0)
Potassium: 3.7 mEq/L (ref 3.5–5.1)
Potassium: 3.8 mEq/L (ref 3.5–5.1)
Potassium: 4 mEq/L (ref 3.5–5.1)
Potassium: 4.9 mEq/L (ref 3.5–5.1)
Sodium: 137 mEq/L (ref 135–145)
Sodium: 134 mEq/L — ABNORMAL LOW (ref 135–145)
Sodium: 133 mEq/L — ABNORMAL LOW (ref 135–145)
Sodium: 137 mEq/L (ref 135–145)

## 2011-06-30 LAB — CBC
HCT: 28 % — ABNORMAL LOW (ref 39.0–52.0)
Hemoglobin: 9.8 g/dL — ABNORMAL LOW (ref 13.0–17.0)
MCH: 30.7 pg (ref 26.0–34.0)
MCHC: 35 g/dL (ref 30.0–36.0)
MCV: 87.8 fL (ref 78.0–100.0)
Platelets: 157 10*3/uL (ref 150–400)
Platelets: 109 10*3/uL — ABNORMAL LOW (ref 150–400)
Platelets: 118 10*3/uL — ABNORMAL LOW (ref 150–400)
RBC: 4.63 MIL/uL (ref 4.22–5.81)
RBC: 3.19 MIL/uL — ABNORMAL LOW (ref 4.22–5.81)
RDW: 15.7 % — ABNORMAL HIGH (ref 11.5–15.5)
RDW: 15.7 % — ABNORMAL HIGH (ref 11.5–15.5)
RDW: 15.6 % — ABNORMAL HIGH (ref 11.5–15.5)
WBC: 6.8 10*3/uL (ref 4.0–10.5)
WBC: 6.7 10*3/uL (ref 4.0–10.5)
WBC: 11.2 10*3/uL — ABNORMAL HIGH (ref 4.0–10.5)

## 2011-06-30 LAB — POCT I-STAT GLUCOSE
Glucose, Bld: 89 mg/dL (ref 70–99)
Glucose, Bld: 108 mg/dL — ABNORMAL HIGH (ref 70–99)
Operator id: 212621
Operator id: 3406

## 2011-06-30 LAB — POCT I-STAT 3, ART BLOOD GAS (G3+)
Acid-base deficit: 1 mmol/L (ref 0.0–2.0)
Acid-base deficit: 5 mmol/L — ABNORMAL HIGH (ref 0.0–2.0)
Bicarbonate: 22.8 mEq/L (ref 20.0–24.0)
Bicarbonate: 21.5 mEq/L (ref 20.0–24.0)
O2 Saturation: 100 %
O2 Saturation: 99 %
O2 Saturation: 93 %
TCO2: 24 mmol/L (ref 0–100)
pCO2 arterial: 45.3 mmHg — ABNORMAL HIGH (ref 35.0–45.0)
pCO2 arterial: 39.7 mmHg (ref 35.0–45.0)
pH, Arterial: 7.31 — ABNORMAL LOW (ref 7.350–7.450)
pO2, Arterial: 265 mmHg — ABNORMAL HIGH (ref 80.0–100.0)
pO2, Arterial: 66 mmHg — ABNORMAL LOW (ref 80.0–100.0)

## 2011-06-30 LAB — BASIC METABOLIC PANEL
Calcium: 9.3 mg/dL (ref 8.4–10.5)
Chloride: 100 mEq/L (ref 96–112)
Creatinine, Ser: 1.07 mg/dL (ref 0.50–1.35)
GFR calc Af Amer: 82 mL/min — ABNORMAL LOW (ref >90)
GFR calc non Af Amer: 71 mL/min — ABNORMAL LOW (ref >90)

## 2011-06-30 LAB — SURGICAL PCR SCREEN: Staphylococcus aureus: NEGATIVE

## 2011-06-30 LAB — GLUCOSE, CAPILLARY
Glucose-Capillary: 73 mg/dL (ref 70–99)
Glucose-Capillary: 64 mg/dL — ABNORMAL LOW (ref 70–99)
Glucose-Capillary: 102 mg/dL — ABNORMAL HIGH (ref 70–99)

## 2011-06-30 LAB — APTT
aPTT: 140 seconds — ABNORMAL HIGH (ref 24–37)
aPTT: 76 seconds — ABNORMAL HIGH (ref 24–37)

## 2011-06-30 LAB — PROTIME-INR
INR: 2.03 — ABNORMAL HIGH (ref 0.00–1.49)
INR: 1.44 (ref 0.00–1.49)
Prothrombin Time: 14.8 seconds (ref 11.6–15.2)
Prothrombin Time: 23.3 seconds — ABNORMAL HIGH (ref 11.6–15.2)
Prothrombin Time: 17.8 seconds — ABNORMAL HIGH (ref 11.6–15.2)

## 2011-06-30 LAB — MAGNESIUM: Magnesium: 2.5 mg/dL (ref 1.5–2.5)

## 2011-06-30 LAB — CREATININE, SERUM
Creatinine, Ser: 1.06 mg/dL (ref 0.50–1.35)
GFR calc non Af Amer: 72 mL/min — ABNORMAL LOW (ref >90)

## 2011-06-30 LAB — HEMOGLOBIN AND HEMATOCRIT, BLOOD: HCT: 27.6 % — ABNORMAL LOW (ref 39.0–52.0)

## 2011-06-30 SURGERY — CORONARY ARTERY BYPASS GRAFTING (CABG)
Anesthesia: General | Site: Chest | Wound class: Clean

## 2011-06-30 MED ORDER — SODIUM CHLORIDE 0.9 % IJ SOLN
3.0000 mL | Freq: Two times a day (BID) | INTRAMUSCULAR | Status: DC
  Administered 2011-07-01 (×2): 3 mL via INTRAVENOUS
  Administered 2011-07-02: 6 mL via INTRAVENOUS
  Administered 2011-07-02 – 2011-07-07 (×9): 3 mL via INTRAVENOUS

## 2011-06-30 MED ORDER — METOPROLOL TARTRATE 12.5 MG HALF TABLET
12.5000 mg | ORAL_TABLET | Freq: Two times a day (BID) | ORAL | Status: DC
  Filled 2011-06-30 (×3): qty 1

## 2011-06-30 MED ORDER — HEMOSTATIC AGENTS (NO CHARGE) OPTIME
TOPICAL | Status: DC | PRN
  Administered 2011-06-30: 1 via TOPICAL

## 2011-06-30 MED ORDER — TRANEXAMIC ACID 100 MG/ML IV SOLN
2500.0000 mg | INTRAVENOUS | Status: DC | PRN
  Administered 2011-06-30: 15 mg/kg/h via INTRAVENOUS

## 2011-06-30 MED ORDER — VANCOMYCIN HCL IN DEXTROSE 1-5 GM/200ML-% IV SOLN
1000.0000 mg | Freq: Once | INTRAVENOUS | Status: AC
  Administered 2011-06-30: 1000 mg via INTRAVENOUS
  Filled 2011-06-30: qty 200

## 2011-06-30 MED ORDER — SODIUM CHLORIDE 0.9 % IJ SOLN: 3.0000 mL | INTRAMUSCULAR | Status: DC | PRN

## 2011-06-30 MED ORDER — SODIUM CHLORIDE 0.9 % IV SOLN
INTRAVENOUS | Status: DC | PRN
  Administered 2011-06-30: 08:00:00 via INTRAVENOUS

## 2011-06-30 MED ORDER — MILRINONE IN DEXTROSE 200-5 MCG/ML-% IV SOLN
INTRAVENOUS | Status: DC | PRN
  Administered 2011-06-30: 1.6 ug/kg/min via INTRAVENOUS

## 2011-06-30 MED ORDER — PROTAMINE SULFATE 10 MG/ML IV SOLN
INTRAVENOUS | Status: DC | PRN
  Administered 2011-06-30: 180 mg via INTRAVENOUS
  Administered 2011-06-30: 10 mg via INTRAVENOUS

## 2011-06-30 MED ORDER — ACETAMINOPHEN 160 MG/5ML PO SOLN: 650.0000 mg | ORAL | Status: AC

## 2011-06-30 MED ORDER — NITROGLYCERIN IN D5W 200-5 MCG/ML-% IV SOLN
INTRAVENOUS | Status: DC | PRN
  Administered 2011-06-30: 16.6 ug/min via INTRAVENOUS

## 2011-06-30 MED ORDER — FAMOTIDINE IN NACL 20-0.9 MG/50ML-% IV SOLN
20.0000 mg | Freq: Two times a day (BID) | INTRAVENOUS | Status: AC
  Administered 2011-07-01: 20 mg via INTRAVENOUS
  Filled 2011-06-30: qty 50

## 2011-06-30 MED ORDER — MORPHINE SULFATE 2 MG/ML IJ SOLN
2.0000 mg | INTRAMUSCULAR | Status: DC | PRN
  Administered 2011-06-30 – 2011-07-01 (×6): 2 mg via INTRAVENOUS
  Administered 2011-07-03: 4 mg via INTRAVENOUS
  Filled 2011-06-30 (×3): qty 1
  Filled 2011-06-30: qty 2
  Filled 2011-06-30 (×2): qty 1

## 2011-06-30 MED ORDER — MIDAZOLAM HCL 2 MG/2ML IJ SOLN
2.0000 mg | INTRAMUSCULAR | Status: DC | PRN
  Administered 2011-06-30: 2 mg via INTRAVENOUS
  Filled 2011-06-30: qty 2

## 2011-06-30 MED ORDER — NITROGLYCERIN IN D5W 200-5 MCG/ML-% IV SOLN: 0.0000 ug/min | INTRAVENOUS | Status: DC

## 2011-06-30 MED ORDER — BISACODYL 10 MG RE SUPP: 10.0000 mg | Freq: Every day | RECTAL | Status: DC

## 2011-06-30 MED ORDER — SODIUM CHLORIDE 0.9 % IV SOLN: INTRAVENOUS | Status: DC

## 2011-06-30 MED ORDER — METOPROLOL TARTRATE 25 MG/10 ML ORAL SUSPENSION
12.5000 mg | Freq: Two times a day (BID) | ORAL | Status: DC
  Filled 2011-06-30 (×3): qty 5

## 2011-06-30 MED ORDER — ACETAMINOPHEN 160 MG/5ML PO SOLN
975.0000 mg | Freq: Four times a day (QID) | ORAL | Status: DC
  Administered 2011-06-30 – 2011-07-01 (×2): 975 mg
  Filled 2011-06-30: qty 40.6

## 2011-06-30 MED ORDER — PHENYLEPHRINE HCL 10 MG/ML IJ SOLN
0.0000 ug/min | INTRAMUSCULAR | Status: DC
  Administered 2011-07-01: 20 ug/min via INTRAVENOUS
  Administered 2011-07-01: 40 ug/min via INTRAVENOUS
  Administered 2011-07-02: 60 ug/min via INTRAVENOUS
  Administered 2011-07-02: 50 ug/min via INTRAVENOUS
  Administered 2011-07-02: 30 ug/min via INTRAVENOUS
  Filled 2011-06-30 (×5): qty 2

## 2011-06-30 MED ORDER — BISACODYL 5 MG PO TBEC
10.0000 mg | DELAYED_RELEASE_TABLET | Freq: Every day | ORAL | Status: DC
  Administered 2011-07-01 – 2011-07-06 (×6): 10 mg via ORAL
  Filled 2011-06-30 (×6): qty 2

## 2011-06-30 MED ORDER — ACETAMINOPHEN 650 MG RE SUPP
650.0000 mg | RECTAL | Status: AC
  Administered 2011-06-30: 650 mg via RECTAL

## 2011-06-30 MED ORDER — ALBUMIN HUMAN 5 % IV SOLN
250.0000 mL | INTRAVENOUS | Status: AC | PRN
  Administered 2011-06-30 – 2011-07-01 (×3): 250 mL via INTRAVENOUS
  Filled 2011-06-30 (×2): qty 250

## 2011-06-30 MED ORDER — SODIUM BICARBONATE 8.4 % IV SOLN
INTRAVENOUS | Status: AC
  Administered 2011-06-30: 11:00:00
  Filled 2011-06-30: qty 2.5

## 2011-06-30 MED ORDER — 0.9 % SODIUM CHLORIDE (POUR BTL) OPTIME
TOPICAL | Status: DC | PRN
  Administered 2011-06-30: 6000 mL

## 2011-06-30 MED ORDER — LACTATED RINGERS IV SOLN
INTRAVENOUS | Status: DC
  Administered 2011-07-06: 20:00:00 via INTRAVENOUS

## 2011-06-30 MED ORDER — DEXTROSE 5 % IV SOLN: 1.5000 g | Freq: Two times a day (BID) | INTRAVENOUS | Status: DC

## 2011-06-30 MED ORDER — DEXTROSE 5 % IV SOLN
INTRAVENOUS | Status: DC | PRN
  Administered 2011-06-30: 07:00:00 via INTRAVENOUS

## 2011-06-30 MED ORDER — DESMOPRESSIN ACETATE 4 MCG/ML IJ SOLN
20.0000 ug | Freq: Once | INTRAMUSCULAR | Status: AC
  Administered 2011-06-30: 20 ug via INTRAVENOUS
  Filled 2011-06-30: qty 5

## 2011-06-30 MED ORDER — METOPROLOL TARTRATE 1 MG/ML IV SOLN
2.5000 mg | INTRAVENOUS | Status: DC | PRN
  Administered 2011-07-07: 2.5 mg via INTRAVENOUS
  Filled 2011-06-30: qty 5

## 2011-06-30 MED ORDER — ASPIRIN EC 325 MG PO TBEC
325.0000 mg | DELAYED_RELEASE_TABLET | Freq: Every day | ORAL | Status: DC
  Administered 2011-07-01 – 2011-07-10 (×10): 325 mg via ORAL
  Filled 2011-06-30 (×10): qty 1

## 2011-06-30 MED ORDER — SODIUM CHLORIDE 0.9 % IV SOLN
200.0000 ug | INTRAVENOUS | Status: DC | PRN
  Administered 2011-06-30: 0.2 ug/kg/h via INTRAVENOUS

## 2011-06-30 MED ORDER — LACTATED RINGERS IV SOLN
INTRAVENOUS | Status: DC | PRN
  Administered 2011-06-30 (×2): via INTRAVENOUS

## 2011-06-30 MED ORDER — PHENYLEPHRINE HCL 10 MG/ML IJ SOLN
20.0000 mg | INTRAVENOUS | Status: DC | PRN
  Administered 2011-06-30: 10 ug/min via INTRAVENOUS

## 2011-06-30 MED ORDER — LACTATED RINGERS IV SOLN
INTRAVENOUS | Status: DC | PRN
  Administered 2011-06-30 (×3): via INTRAVENOUS

## 2011-06-30 MED ORDER — DEXTROSE 50 % IV SOLN
INTRAVENOUS | Status: AC
  Administered 2011-06-30: 15 mL
  Filled 2011-06-30: qty 50

## 2011-06-30 MED ORDER — 0.9 % SODIUM CHLORIDE (POUR BTL) OPTIME
TOPICAL | Status: DC | PRN
  Administered 2011-06-30: 2000 mL

## 2011-06-30 MED ORDER — OXYCODONE HCL 5 MG PO TABS
5.0000 mg | ORAL_TABLET | ORAL | Status: DC | PRN
  Administered 2011-07-01: 10 mg via ORAL
  Administered 2011-07-01: 5 mg via ORAL
  Administered 2011-07-01 – 2011-07-02 (×2): 10 mg via ORAL
  Administered 2011-07-06: 5 mg via ORAL
  Filled 2011-06-30: qty 2
  Filled 2011-06-30: qty 1
  Filled 2011-06-30 (×3): qty 2

## 2011-06-30 MED ORDER — SODIUM CHLORIDE 0.9 % IV SOLN
100.0000 [IU] | INTRAVENOUS | Status: DC | PRN
  Administered 2011-06-30: .9 [IU]/h via INTRAVENOUS

## 2011-06-30 MED ORDER — MILRINONE IN DEXTROSE 200-5 MCG/ML-% IV SOLN
0.1500 ug/kg/min | INTRAVENOUS | Status: DC
  Administered 2011-06-30: 0.33 ug/kg/min via INTRAVENOUS
  Filled 2011-06-30 (×2): qty 100

## 2011-06-30 MED ORDER — ROCURONIUM BROMIDE 100 MG/10ML IV SOLN
INTRAVENOUS | Status: DC | PRN
  Administered 2011-06-30: 50 mg via INTRAVENOUS
  Administered 2011-06-30: 20 mg via INTRAVENOUS
  Administered 2011-06-30: 30 mg via INTRAVENOUS
  Administered 2011-06-30: 50 mg via INTRAVENOUS
  Administered 2011-06-30: 20 mg via INTRAVENOUS

## 2011-06-30 MED ORDER — SODIUM CHLORIDE 0.9 % IV SOLN: 250.0000 mL | INTRAVENOUS | Status: DC

## 2011-06-30 MED ORDER — GLYCOPYRROLATE 0.2 MG/ML IJ SOLN
INTRAMUSCULAR | Status: DC | PRN
  Administered 2011-06-30: 0.1 mg via INTRAVENOUS

## 2011-06-30 MED ORDER — MAGNESIUM SULFATE 40 MG/ML IJ SOLN
INTRAMUSCULAR | Status: AC
  Filled 2011-06-30: qty 100

## 2011-06-30 MED ORDER — POTASSIUM CHLORIDE 10 MEQ/50ML IV SOLN: 10.0000 meq | INTRAVENOUS | Status: AC

## 2011-06-30 MED ORDER — LEVOTHYROXINE SODIUM 50 MCG PO TABS
50.0000 ug | ORAL_TABLET | Freq: Every day | ORAL | Status: DC
  Administered 2011-07-01 – 2011-07-10 (×9): 50 ug via ORAL
  Filled 2011-06-30 (×11): qty 1

## 2011-06-30 MED ORDER — FUROSEMIDE 10 MG/ML IJ SOLN
10.0000 mg | Freq: Once | INTRAMUSCULAR | Status: AC
  Administered 2011-06-30: 10 mg via INTRAVENOUS
  Filled 2011-06-30: qty 1

## 2011-06-30 MED ORDER — INSULIN ASPART 100 UNIT/ML ~~LOC~~ SOLN
0.0000 [IU] | SUBCUTANEOUS | Status: DC
  Administered 2011-07-01: 2 [IU] via SUBCUTANEOUS

## 2011-06-30 MED ORDER — HEPARIN SODIUM (PORCINE) 1000 UNIT/ML IJ SOLN
INTRAMUSCULAR | Status: DC | PRN
  Administered 2011-06-30: 2000 [IU] via INTRAVENOUS
  Administered 2011-06-30: 24000 [IU] via INTRAVENOUS

## 2011-06-30 MED ORDER — SODIUM CHLORIDE 0.9 % IV SOLN
0.1000 ug/kg/h | INTRAVENOUS | Status: DC
  Administered 2011-06-30: 0.5 ug/kg/h via INTRAVENOUS
  Administered 2011-07-01: 0.7 ug/kg/h via INTRAVENOUS
  Administered 2011-07-01: 0.4 ug/kg/h via INTRAVENOUS
  Filled 2011-06-30 (×3): qty 2

## 2011-06-30 MED ORDER — SODIUM CHLORIDE 0.45 % IV SOLN
INTRAVENOUS | Status: DC
  Administered 2011-06-30 – 2011-07-01 (×2): via INTRAVENOUS

## 2011-06-30 MED ORDER — DOPAMINE-DEXTROSE 3.2-5 MG/ML-% IV SOLN
0.0000 ug/kg/min | INTRAVENOUS | Status: DC
  Administered 2011-07-02: 4 ug/kg/min via INTRAVENOUS
  Filled 2011-06-30 (×2): qty 250

## 2011-06-30 MED ORDER — ASPIRIN 81 MG PO CHEW
324.0000 mg | CHEWABLE_TABLET | Freq: Every day | ORAL | Status: DC
  Filled 2011-06-30: qty 4

## 2011-06-30 MED ORDER — LACTATED RINGERS IV SOLN: 500.0000 mL | Freq: Once | INTRAVENOUS | Status: AC | PRN

## 2011-06-30 MED ORDER — LIDOCAINE HCL (CARDIAC) 20 MG/ML IV SOLN
INTRAVENOUS | Status: DC | PRN
  Administered 2011-06-30: 60 mg via INTRAVENOUS

## 2011-06-30 MED ORDER — MOXIFLOXACIN HCL IN NACL 400 MG/250ML IV SOLN
400.0000 mg | INTRAVENOUS | Status: AC
  Administered 2011-07-01 – 2011-07-02 (×2): 400 mg via INTRAVENOUS
  Filled 2011-06-30 (×2): qty 250

## 2011-06-30 MED ORDER — DIPHENHYDRAMINE HCL 50 MG/ML IJ SOLN
INTRAMUSCULAR | Status: DC | PRN
  Administered 2011-06-30: 50 mg via INTRAVENOUS

## 2011-06-30 MED ORDER — MAGNESIUM SULFATE 40 MG/ML IJ SOLN: 4.0000 g | Freq: Once | INTRAMUSCULAR | Status: DC

## 2011-06-30 MED ORDER — LACTATED RINGERS IV SOLN
INTRAVENOUS | Status: DC | PRN
  Administered 2011-06-30: 07:00:00 via INTRAVENOUS

## 2011-06-30 MED ORDER — DOCUSATE SODIUM 100 MG PO CAPS
200.0000 mg | ORAL_CAPSULE | Freq: Every day | ORAL | Status: DC
  Administered 2011-07-01 – 2011-07-06 (×6): 200 mg via ORAL
  Filled 2011-06-30 (×10): qty 2

## 2011-06-30 MED ORDER — MILRINONE IN DEXTROSE 200-5 MCG/ML-% IV SOLN
0.3750 ug/kg/min | INTRAVENOUS | Status: DC
  Filled 2011-06-30: qty 100

## 2011-06-30 MED ORDER — SODIUM CHLORIDE 0.9 % IJ SOLN
OROMUCOSAL | Status: DC | PRN
  Administered 2011-06-30: 10:00:00 via TOPICAL

## 2011-06-30 MED ORDER — PROTAMINE SULFATE 10 MG/ML IV SOLN
25.0000 mg | Freq: Once | INTRAVENOUS | Status: AC
  Administered 2011-06-30: 25 mg via INTRAVENOUS
  Filled 2011-06-30: qty 5

## 2011-06-30 MED ORDER — INSULIN REGULAR BOLUS VIA INFUSION
0.0000 [IU] | Freq: Three times a day (TID) | INTRAVENOUS | Status: DC
  Filled 2011-06-30: qty 10

## 2011-06-30 MED ORDER — FENTANYL CITRATE 0.05 MG/ML IJ SOLN
INTRAMUSCULAR | Status: DC | PRN
  Administered 2011-06-30: 250 ug via INTRAVENOUS
  Administered 2011-06-30: 50 ug via INTRAVENOUS
  Administered 2011-06-30: 200 ug via INTRAVENOUS
  Administered 2011-06-30: 100 ug via INTRAVENOUS
  Administered 2011-06-30: 450 ug via INTRAVENOUS
  Administered 2011-06-30: 250 ug via INTRAVENOUS
  Administered 2011-06-30 (×3): 50 ug via INTRAVENOUS

## 2011-06-30 MED ORDER — MORPHINE SULFATE 2 MG/ML IJ SOLN
1.0000 mg | INTRAMUSCULAR | Status: AC | PRN
  Filled 2011-06-30: qty 1

## 2011-06-30 MED ORDER — DOPAMINE-DEXTROSE 3.2-5 MG/ML-% IV SOLN
INTRAVENOUS | Status: DC | PRN
  Administered 2011-06-30: 3 ug/kg/min via INTRAVENOUS

## 2011-06-30 MED ORDER — PANTOPRAZOLE SODIUM 40 MG PO TBEC
40.0000 mg | DELAYED_RELEASE_TABLET | Freq: Every day | ORAL | Status: DC
  Administered 2011-07-02 – 2011-07-09 (×8): 40 mg via ORAL
  Filled 2011-06-30 (×7): qty 1

## 2011-06-30 MED ORDER — INSULIN ASPART 100 UNIT/ML ~~LOC~~ SOLN
0.0000 [IU] | SUBCUTANEOUS | Status: AC
  Administered 2011-06-30: 2 [IU] via SUBCUTANEOUS

## 2011-06-30 MED ORDER — ALBUMIN HUMAN 5 % IV SOLN
INTRAVENOUS | Status: DC | PRN
  Administered 2011-06-30: 15:00:00 via INTRAVENOUS

## 2011-06-30 MED ORDER — ONDANSETRON HCL 4 MG/2ML IJ SOLN
4.0000 mg | Freq: Four times a day (QID) | INTRAMUSCULAR | Status: DC | PRN
Start: 2011-06-30 — End: 2011-07-10
  Administered 2011-07-02: 4 mg via INTRAVENOUS
  Filled 2011-06-30: qty 2

## 2011-06-30 MED ORDER — ACETAMINOPHEN 500 MG PO TABS
1000.0000 mg | ORAL_TABLET | Freq: Four times a day (QID) | ORAL | Status: AC
  Administered 2011-07-01 – 2011-07-05 (×15): 1000 mg via ORAL
  Filled 2011-06-30 (×18): qty 2

## 2011-06-30 MED ORDER — PROPOFOL 10 MG/ML IV EMUL
INTRAVENOUS | Status: DC | PRN
  Administered 2011-06-30: 60 mg via INTRAVENOUS
  Administered 2011-06-30: 40 mg via INTRAVENOUS

## 2011-06-30 SURGICAL SUPPLY — 108 items
ADAPTER CARDIO PERF ANTE/RETRO (ADAPTER) ×2 IMPLANT
ATTRACTOMAT 16X20 MAGNETIC DRP (DRAPES) ×2 IMPLANT
BAG DECANTER FOR FLEXI CONT (MISCELLANEOUS) ×2 IMPLANT
BANDAGE ELASTIC 4 VELCRO ST LF (GAUZE/BANDAGES/DRESSINGS) ×4 IMPLANT
BANDAGE ELASTIC 6 VELCRO ST LF (GAUZE/BANDAGES/DRESSINGS) ×4 IMPLANT
BANDAGE GAUZE ELAST BULKY 4 IN (GAUZE/BANDAGES/DRESSINGS) ×4 IMPLANT
BANDAGE HEMOSTAT MRDH 4X4 STRL (MISCELLANEOUS) ×1 IMPLANT
BASKET HEART (ORDER IN 25'S) (MISCELLANEOUS) ×1
BASKET HEART (ORDER IN 25S) (MISCELLANEOUS) ×1 IMPLANT
BENZOIN TINCTURE PRP APPL 2/3 (GAUZE/BANDAGES/DRESSINGS) ×2 IMPLANT
BLADE STERNUM SYSTEM 6 (BLADE) ×2 IMPLANT
BLADE SURG 11 STRL SS (BLADE) ×2 IMPLANT
BNDG HEMOSTAT MRDH 4X4 STRL (MISCELLANEOUS) ×2
CANISTER SUCTION 2500CC (MISCELLANEOUS) ×2 IMPLANT
CANN PRFSN .5XCNCT 15X34-48 (MISCELLANEOUS)
CANNULA GUNDRY RCSP 15FR (MISCELLANEOUS) ×2 IMPLANT
CANNULA PRFSN .5XCNCT 15X34-48 (MISCELLANEOUS) IMPLANT
CANNULA SOFTFLOW AORTIC 7M21FR (CANNULA) ×2 IMPLANT
CANNULA VEN 2 STAGE (MISCELLANEOUS)
CANNULA VESSEL W/WING WO/VALVE (CANNULA) ×2 IMPLANT
CATH CPB KIT HENDRICKSON (MISCELLANEOUS) ×2 IMPLANT
CATH ROBINSON RED A/P 18FR (CATHETERS) ×4 IMPLANT
CATH THORACIC 36FR (CATHETERS) ×2 IMPLANT
CATH THORACIC 36FR RT ANG (CATHETERS) ×2 IMPLANT
CLIP FOGARTY SPRING 6M (CLIP) ×2 IMPLANT
CLIP TI MEDIUM 24 (CLIP) IMPLANT
CLIP TI WIDE RED SMALL 24 (CLIP) ×8 IMPLANT
CLOTH BEACON ORANGE TIMEOUT ST (SAFETY) ×2 IMPLANT
COVER MAYO STAND STRL (DRAPES) ×2 IMPLANT
COVER SURGICAL LIGHT HANDLE (MISCELLANEOUS) ×4 IMPLANT
CRADLE DONUT ADULT HEAD (MISCELLANEOUS) ×2 IMPLANT
DRAPE CARDIOVASCULAR INCISE (DRAPES) ×1
DRAPE SLUSH MACHINE 52X66 (DRAPES) IMPLANT
DRAPE SLUSH/WARMER DISC (DRAPES) ×2 IMPLANT
DRAPE SRG 135X102X78XABS (DRAPES) ×1 IMPLANT
DRSG COVADERM 4X14 (GAUZE/BANDAGES/DRESSINGS) ×2 IMPLANT
ELECT REM PT RETURN 9FT ADLT (ELECTROSURGICAL) ×4
ELECTRODE REM PT RTRN 9FT ADLT (ELECTROSURGICAL) ×2 IMPLANT
GLOVE BIO SURGEON STRL SZ 6 (GLOVE) ×18 IMPLANT
GLOVE BIO SURGEON STRL SZ 6.5 (GLOVE) ×6 IMPLANT
GLOVE BIO SURGEON STRL SZ7 (GLOVE) IMPLANT
GLOVE BIO SURGEON STRL SZ7.5 (GLOVE) IMPLANT
GLOVE BIOGEL PI IND STRL 6 (GLOVE) ×1 IMPLANT
GLOVE BIOGEL PI IND STRL 6.5 (GLOVE) IMPLANT
GLOVE BIOGEL PI IND STRL 7.0 (GLOVE) IMPLANT
GLOVE BIOGEL PI IND STRL 7.5 (GLOVE) IMPLANT
GLOVE BIOGEL PI INDICATOR 6 (GLOVE) ×1
GLOVE BIOGEL PI INDICATOR 6.5 (GLOVE)
GLOVE BIOGEL PI INDICATOR 7.0 (GLOVE)
GLOVE BIOGEL PI INDICATOR 7.5 (GLOVE)
GLOVE EUDERMIC 7 POWDERFREE (GLOVE) ×6 IMPLANT
GOWN PREVENTION PLUS XLARGE (GOWN DISPOSABLE) ×10 IMPLANT
GOWN STRL NON-REIN LRG LVL3 (GOWN DISPOSABLE) ×14 IMPLANT
HEMOSTAT POWDER SURGIFOAM 1G (HEMOSTASIS) ×6 IMPLANT
HEMOSTAT SURGICEL 2X14 (HEMOSTASIS) ×8 IMPLANT
HEMOSTAT SURGICEL 2X4 FIBR (HEMOSTASIS) ×2 IMPLANT
INSERT FOGARTY XLG (MISCELLANEOUS) IMPLANT
KIT BASIN OR (CUSTOM PROCEDURE TRAY) ×2 IMPLANT
KIT ROOM TURNOVER OR (KITS) ×2 IMPLANT
KIT SUCTION CATH 14FR (SUCTIONS) ×4 IMPLANT
KIT VASOVIEW W/TROCAR VH 2000 (KITS) ×2 IMPLANT
MARKER GRAFT CORONARY BYPASS (MISCELLANEOUS) ×8 IMPLANT
NS IRRIG 1000ML POUR BTL (IV SOLUTION) ×16 IMPLANT
PACK OPEN HEART (CUSTOM PROCEDURE TRAY) ×2 IMPLANT
PAD ARMBOARD 7.5X6 YLW CONV (MISCELLANEOUS) ×4 IMPLANT
PENCIL BUTTON HOLSTER BLD 10FT (ELECTRODE) ×2 IMPLANT
PUNCH AORTIC ROTATE 4.0MM (MISCELLANEOUS) ×2 IMPLANT
PUNCH AORTIC ROTATE 4.5MM 8IN (MISCELLANEOUS) IMPLANT
PUNCH AORTIC ROTATE 5MM 8IN (MISCELLANEOUS) IMPLANT
SET CARDIOPLEGIA MPS 5001102 (MISCELLANEOUS) ×2 IMPLANT
SPONGE GAUZE 4X4 12PLY (GAUZE/BANDAGES/DRESSINGS) ×10 IMPLANT
SPONGE LAP 18X18 X RAY DECT (DISPOSABLE) ×4 IMPLANT
SPONGE LAP 4X18 X RAY DECT (DISPOSABLE) ×2 IMPLANT
STRIP CLOSURE SKIN 1/2X4 (GAUZE/BANDAGES/DRESSINGS) ×2 IMPLANT
SUT BONE WAX W31G (SUTURE) ×2 IMPLANT
SUT MNCRL AB 4-0 PS2 18 (SUTURE) ×4 IMPLANT
SUT PROLENE 3 0 SH DA (SUTURE) ×2 IMPLANT
SUT PROLENE 4 0 RB 1 (SUTURE) ×4
SUT PROLENE 4 0 SH DA (SUTURE) IMPLANT
SUT PROLENE 4-0 RB1 .5 CRCL 36 (SUTURE) ×4 IMPLANT
SUT PROLENE 6 0 C 1 30 (SUTURE) ×8 IMPLANT
SUT PROLENE 7 0 BV 1 (SUTURE) ×18 IMPLANT
SUT PROLENE 7 0 BV1 MDA (SUTURE) ×10 IMPLANT
SUT PROLENE 8 0 BV175 6 (SUTURE) IMPLANT
SUT SILK  1 MH (SUTURE) ×1
SUT SILK 1 MH (SUTURE) ×1 IMPLANT
SUT STEEL 6MS V (SUTURE) IMPLANT
SUT STEEL STERNAL CCS#1 18IN (SUTURE) IMPLANT
SUT STEEL SZ 6 DBL 3X14 BALL (SUTURE) ×6 IMPLANT
SUT VIC AB 1 CTX 36 (SUTURE) ×2
SUT VIC AB 1 CTX36XBRD ANBCTR (SUTURE) ×2 IMPLANT
SUT VIC AB 2-0 CT1 27 (SUTURE) ×2
SUT VIC AB 2-0 CT1 TAPERPNT 27 (SUTURE) ×2 IMPLANT
SUT VIC AB 2-0 CTX 27 (SUTURE) IMPLANT
SUT VIC AB 3-0 SH 27 (SUTURE)
SUT VIC AB 3-0 SH 27X BRD (SUTURE) IMPLANT
SUT VIC AB 3-0 X1 27 (SUTURE) IMPLANT
SUT VICRYL 4-0 PS2 18IN ABS (SUTURE) IMPLANT
SUTURE E-PAK OPEN HEART (SUTURE) ×2 IMPLANT
SYSTEM SAHARA CHEST DRAIN ATS (WOUND CARE) ×2 IMPLANT
TAPE CLOTH SURG 4X10 WHT LF (GAUZE/BANDAGES/DRESSINGS) ×6 IMPLANT
TOWEL OR 17X24 6PK STRL BLUE (TOWEL DISPOSABLE) ×4 IMPLANT
TOWEL OR 17X26 10 PK STRL BLUE (TOWEL DISPOSABLE) ×4 IMPLANT
TRAY FOLEY IC TEMP SENS 14FR (CATHETERS) ×2 IMPLANT
TUBE FEEDING 8FR 16IN STR KANG (MISCELLANEOUS) ×2 IMPLANT
TUBING INSUFFLATION 10FT LAP (TUBING) ×2 IMPLANT
UNDERPAD 30X30 INCONTINENT (UNDERPADS AND DIAPERS) ×2 IMPLANT
WATER STERILE IRR 1000ML POUR (IV SOLUTION) ×4 IMPLANT

## 2011-06-30 NOTE — Care Management Note (Unsigned)
    Page 1 of 1   07/03/2011     10:56:10 AM   CARE MANAGEMENT NOTE 07/03/2011  Patient:  [name] [name], [name] [name]   Account Number:  [id number]  Date Initiated:  06/30/2011  Documentation initiated by:  GRAVES-BIGELOW,BRENDA  Subjective/Objective Assessment:   Pt admitted as [name] transfer from [facility] plan for CABG. CABG today.     Action/Plan:   CM will continue to monitor for disposiiton needs post procedure.   Anticipated DC Date:  07/06/2011   Anticipated DC Plan:  HOME W HOME HEALTH SERVICES      DC Planning Services  CM consult      Choice offered to / List presented to:             Status of service:  In process, will continue to follow Medicare Important Message given?   (If response is "NO", the following Medicare IM given date fields will be blank) Date Medicare IM given:   Date Additional Medicare IM given:    Discharge Disposition:    Per UR Regulation:  Reviewed for med. necessity/level of care/duration of stay  If discussed at Long Length of Stay Meetings, dates discussed:    Comments:  07/02/11 [name] Suminski,RN,BSN 1210 PTA, PT INDEPENDENT, LIVES ALONE.  HAS SUPPORTIVE DAUGHTER. PT INTERESTED IN SNF PLACEMENT AT DC.  WILL CONSULT CSW FOR SHORT TERM SNF PLACEMENT AT DISCHARGE.  WILL CONSULT PT/OT. WILL FOLLOW.

## 2011-06-30 NOTE — Progress Notes (Signed)
  Echocardiogram Echocardiogram Transesophageal has been performed.  [name] [name] 06/30/2011, 10:40 AM

## 2011-06-30 NOTE — Progress Notes (Signed)
SICU p.m. Rounds Status post CABG today The patient remains intubated on PEEP 10 cm H2O Chest tube drainage 200 cc per hour but patient receiving blood products for coagulopathy. Hemodynamics stable Will closely monitor chest tube output and hematocrit

## 2011-06-30 NOTE — Anesthesia Preprocedure Evaluation (Signed)
Anesthesia Evaluation  Patient identified by MRN, date of birth, ID band Patient awake    Reviewed: Allergy & Precautions, H&P , NPO status , Patient's Chart, lab work & pertinent test results  Airway Mallampati: I TM Distance: >3 FB Neck ROM: full    Dental   Pulmonary shortness of breath, pneumonia ,          Cardiovascular hypertension, + CAD, + Past MI and + Peripheral Vascular Disease + Valvular Problems/Murmurs Rhythm:regular Rate:Normal     Neuro/Psych    GI/Hepatic   Endo/Other    Renal/GU      Musculoskeletal   Abdominal   Peds  Hematology   Anesthesia Other Findings   Reproductive/Obstetrics                           Anesthesia Physical Anesthesia Plan  ASA: III  Anesthesia Plan: General   Post-op Pain Management:    Induction: Intravenous  Airway Management Planned: Oral ETT  Additional Equipment: CVP, Arterial line and PA Cath  Intra-op Plan:   Post-operative Plan: Post-operative intubation/ventilation  Informed Consent: I have reviewed the patients History and Physical, chart, labs and discussed the procedure including the risks, benefits and alternatives for the proposed anesthesia with the patient or authorized representative who has indicated his/her understanding and acceptance.     Plan Discussed with: CRNA, Anesthesiologist and Surgeon  Anesthesia Plan Comments:         Anesthesia Quick Evaluation

## 2011-06-30 NOTE — Progress Notes (Signed)
INITIAL ADULT NUTRITION ASSESSMENT Date: 06/30/2011   Time: 3:43 PM  Reason for Assessment: Nutrition Risk Report  ASSESSMENT: Male 66 y.o.  Dx: CAD (coronary artery disease)  Hx:  Past Medical History  Diagnosis Date  . Hyperlipidemia   . Hypertension   . Coronary artery disease   . Peripheral vascular disease 06/26/11    "blockages"  . 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 Lady Gary thinks I've had 2 that I didn't know about"  . Shortness of breath 06/26/11    "all the time for a good while"  . Pneumonia 1950-1960's  . Hypothyroidism     Related Meds:     . aspirin EC  81 mg Oral Daily  . atorvastatin  20 mg Oral QHS  . carvedilol  12.5 mg Oral BID WC  . cefUROXime (ZINACEF)  IV  1.5 g Intravenous To OR  . chlorhexidine  60 mL Topical Once  . chlorhexidine   Topical Once  . diazepam  5 mg Oral Once  . furosemide  40 mg Oral BID  . levothyroxine  50 mcg Oral Daily  . lisinopril  10 mg Oral Daily  . nitroglycerin-nicardipine-HEPARIN-sodium bicarbonate irrigation for artery spasm   Irrigation To OR  . nitroglycerin-nicardipine-HEPARIN-sodium bicarbonate irrigation for artery spasm   Irrigation To OR  . potassium chloride SA  20 mEq Oral Daily  . sodium chloride  3 mL Intravenous Q12H  . vancomycin  1,250 mg Intravenous To OR  . DISCONTD: bisacodyl  5 mg Oral Once  . DISCONTD: cefUROXime (ZINACEF)  IV  750 mg Intravenous To OR  . DISCONTD: dexmedetomidine (PRECEDEX) IV infusion for high rates  0.1-0.7 mcg/kg/hr Intravenous To OR  . DISCONTD: DOPamine  2-20 mcg/kg/min Intravenous To OR  . DISCONTD: epinephrine  0.5-20 mcg/min Intravenous To OR  . DISCONTD: insulin (NOVOLIN-R) infusion   Intravenous To OR  . DISCONTD: magnesium sulfate  40 mEq Other To OR  . DISCONTD: metoprolol tartrate  12.5 mg Oral Once  . DISCONTD: nitroGLYCERIN  2-200 mcg/min Intravenous To OR  . DISCONTD: phenylephrine (NEO-SYNEPHRINE) Adult infusion  30-200 mcg/min Intravenous To OR  .  DISCONTD: potassium chloride  80 mEq Other To OR  . DISCONTD: tranexamic acid  15 mg/kg Intravenous To OR  . DISCONTD: tranexamic acid  2 mg/kg Intracatheter To OR  . DISCONTD: tranexamic acid (CYKLOKAPRON) infusion ([facility])  1.5 mg/kg/hr Intravenous To OR    Ht: 6' (182.9 cm)  Wt: 164 lb 12.8 oz (74.753 kg)  Ideal Wt: 81 kg % Ideal Wt: 92%  Usual Wt: unable to obtain % Usual Wt: ---  Body mass index is 22.35 kg/(m^2).  Food/Nutrition Related Hx: unintentional weight loss > 10 lbs within the past month per admission nutrition screen  Labs:  CMP     Component Value Date/Time   NA 136 06/30/2011 1419   K 4.9 06/30/2011 1419   CL 100 06/30/2011 0512   CO2 24 06/30/2011 0512   GLUCOSE 117* 06/30/2011 1419   BUN 14 06/30/2011 0512   CREATININE 1.07 06/30/2011 0512   CALCIUM 9.3 06/30/2011 0512   PROT 7.1 06/26/2011 1711   ALBUMIN 3.4* 06/26/2011 1711   AST 19 06/26/2011 1711   ALT 15 06/26/2011 1711   ALKPHOS 203* 06/26/2011 1711   BILITOT 1.4* 06/26/2011 1711   GFRNONAA 71* 06/30/2011 0512   GFRAA 82* 06/30/2011 0512     Intake/Output Summary (Last 24 hours) at 06/30/11 1544 Last data filed at 06/30/11 1500  Gross per 24 hour  Intake   5415 ml  Output   1775 ml  Net   3640 ml    HCT  Date Value Range Status  06/30/2011 26.0* 39.0-52.0 (%) Final    Diet Order: NPO  Supplements/Tube Feeding: N/A  IVF:    heparin Last Rate: 1,400 Units/hr (06/29/11 2300)  DISCONTD: milrinone     Estimated Nutritional Needs:   Kcal: 2000-2200 Protein: 100-110 gm Fluid: 1.9-2.1 L  RD unable to obtain nutrition hx at this time; patient transferred from [facility] for cardiothoracic surgery evaluation after undergoing elective cardiac cath on 6/5 revealing severe three-vessel CAD; s/p CABG 6/11; prior to surgery was consuming 50-100% on a Heart Healthy diet; would benefit from supplementation as needed when able  NUTRITION DIAGNOSIS: -Inadequate oral intake (NI-2.1).   Status: Ongoing  RELATED TO: inability to eat, s/p CABG  AS EVIDENCE BY: NPO status  MONITORING/EVALUATION(Goals): Goal: meet >90% of estimated nutrition needs Monitor: PO diet advancement, PO intake, weight, labs, I/O's  EDUCATION NEEDS: -No education needs identified at this time  INTERVENTION:  Advance diet as medically appropriate  RD to follow for nutrition care plan, add intervention/make recommendations accordingly  Dietitian #: [phone]  DOCUMENTATION CODES Per approved criteria  -Not Applicable    Alger Memos 06/30/2011, 3:43 PM

## 2011-06-30 NOTE — OR Nursing (Signed)
Time out performed at 0834; leg incision at 0835. Second time out performed at 0839; chest incision at 0840.

## 2011-06-30 NOTE — Anesthesia Procedure Notes (Signed)
Procedure Name: Intubation Date/Time: 06/30/2011 7:54 AM Performed by: Darcey Nora B Pre-anesthesia Checklist: Patient identified, Emergency Drugs available, Suction available and Patient being monitored Oxygen Delivery Method: Circle system utilized Preoxygenation: Pre-oxygenation with 100% oxygen Intubation Type: IV induction Ventilation: Mask ventilation without difficulty Tube size: 8.0 mm Number of attempts: 1 Airway Equipment and Method: Stylet Placement Confirmation: ETT inserted through vocal cords under direct vision,  positive ETCO2 and breath sounds checked- equal and bilateral Secured at: 21 (cm at teeth) cm Tube secured with: Tape Dental Injury: Teeth and Oropharynx as per pre-operative assessment

## 2011-06-30 NOTE — OR Nursing (Signed)
At completion of case when drapes removed noted that patient's bilateral lower and upper extremities were red/purple in color, bilateral feet slightly mottled in appearance and cool to touch.  Dr. Laverle Hobby and Dr. Dorris Fetch notified and patient assessed by both doctors.  Will continue to monitor patient.

## 2011-06-30 NOTE — Op Note (Signed)
NAMEHERALD, VALLIN NO.:  [id number]  MEDICAL RECORD NO.:  [id number]  LOCATION:  2307                         FACILITY:  [facility]  PHYSICIAN:  Salvatore Decent. Dorris Fetch, M.D.DATE OF BIRTH:  22-Feb-1945  DATE OF PROCEDURE:  06/30/2011 DATE OF DISCHARGE:                              OPERATIVE REPORT   PREOPERATIVE DIAGNOSIS:  Left main and severe three-vessel coronary artery disease with severe ischemic cardiomyopathy.  POSTOPERATIVE DIAGNOSIS:  Left main and severe three-vessel coronary artery disease with severe ischemic cardiomyopathy.  PROCEDURE:  Median sternotomy, extracorporeal circulation, coronary artery bypass grafting x5 (saphenous vein graft to left anterior descending artery, saphenous vein graft to second diagonal, saphenous vein graft sequentially to the first diagonal and obtuse marginal 1, saphenous vein graft to posterior descending), endoscopic vein harvest, right leg and left thigh.  SURGEON:  Salvatore Decent. Dorris Fetch, MD  ASSISTANT:  Coral Ceo, PA  SECOND ASSISTANT:  Lowella Dandy, PA  ANESTHESIA:  General.  FINDINGS:  Left internal mammary artery not usable as pedicle or as free graft, vein fair quality, targets poor quality, severe left ventricular hypertrophy and cardiomegaly with left ventricular dilatation.  Global hypokinesis with apical akinesis.  Improved left ventricular wall motion postbypass, rash noted on legs during vein harvest and noted on full body postoperatively.  CLINICAL NOTE:  Mr. Metzgar is a 66 year old gentleman with severe diffuse atherosclerotic cardiovascular disease who presented with congestive heart failure.  He was found to have severe left ventricular dysfunction, and at cardiac catheterization, had left main and three- vessel disease with diffusely diseased vessels and an ejection fraction of 20-25%.  He was transferred to [facility].  Further workup included CT angio of the neck to evaluate carotid  arteries, it showed 75% bilateral internal carotid stenoses. A cardiac MRI was done to assess cardiac viability, it showed scar at the apex, but the majority of the hypokinetic segments did have viable myocardium.  The patient was advised to undergo coronary artery bypass grafting.  He did understand that this was still high risk despite the MR findings given his severe left ventricular dysfunction as well as severe nature of his coronary artery disease and associated generalized atherosclerotic cardiovascular disease.  He understood the risks of surgery included, but were not limited to death, stroke, MI, DVT, PE, bleeding, possible need for transfusions, infections as well as other organ system dysfunction including respiratory, renal, or GI complications.  He understood and accepted these risks and agreed to proceed.  OPERATIVE NOTE:  Mr. Schrom was brought to the preop holding area on June 30, 2011, there, the Anesthesia Service placed a Swan-Ganz catheter and arterial blood pressure monitoring line.  Of note, he had pulmonary hypertension with PA pressures approximately two thirds of systemic. Intravenous antibiotics were administered.  He was taken to the operating room, anesthetized, and intubated.  Transesophageal echocardiography was performed.  It revealed severe left ventricular dysfunction, left ventricular hypertrophy and dilatation.  There was mild mitral regurgitation.  The right ventricle did appear hypertrophied.  It should also be noted that the patient's cardiac index was low prior to beginning the operation with cardiac output of 2.4, although the patient was bradycardic at the  time.  The chest, abdomen and legs were prepped and draped in usual sterile fashion.  An incision was made in the medial aspect of the right leg at the level of the knee.  The greater saphenous vein was identified and was harvested endoscopically from the right thigh.  Simultaneously  with this, a median sternotomy was performed and the left internal mammary artery was harvested using standard technique.  2000 units of heparin was administered during the vessel harvest, the remainder of the full heparin dose was given prior to opening the pericardium.  It was noted during the vein harvest that the patient was developing an erythematous rash on the legs.  After harvesting the conduits, it was obvious that additional vein would be needed, so the left saphenous vein was harvested from the left thigh endoscopically.  After dividing the distal end of the mammary artery, there was relatively sluggish flow through the vessel.  A 1.5-mm probe could not be passed proximally due to diffuse atherosclerotic disease within the vessel, a 1-mm probe did pass.  Wolfe's solution was injected into the artery intraluminally and a clamp placed distally for later reassessment.  The pericardium was opened.  There was a moderate pericardial effusion. It should also be noted that the patient had approximately 1 liter of pleural fluid in both pleural spaces.  The ascending aorta was inspected.  There was no palpable atherosclerotic disease.  The aorta was cannulated via concentric 2-0 Ethibond pledgeted pursestring sutures.  A dual stage venous cannula was placed via pursestring suture in the right atrial appendage.  Cardiopulmonary bypass was instituted and the patient was cooled to 32 degrees Celsius.  The coronary arteries were inspected and anastomotic sites were chosen.  There was an inflammatory reaction on the epicardium, which made dissection of the coronary arteries difficult.  The arteries were diffusely diseased and all were of poor quality in general.  The conduits were inspected and cut to length.  Flow through the mammary artery was still sluggish.  The patient did have known moderate subclavian stenosis.  The mammary artery was clamped and divided proximally and the proximal  stump was suture ligated with a 2-0 silk suture.  A retrograde cardioplegic cannula was placed via purse-string suture in the right atrium and directed into the coronary sinus.  An antegrade cardioplegic cannula was placed in the ascending aorta.  A temperature probe was placed in the myocardial septum and a foam pad was placed in the pericardium to insulate the heart and protect the left phrenic nerve.  The aorta was crossclamped.  The left ventricle was decompressed via the aortic root vent.  Cardiac arrest then was achieved with combination of cold antegrade and retrograde blood cardioplegia and topical iced saline.  A 750 mL of cardioplegia was administered antegrade.  There was a diastolic arrest and initial septal cooling to approximately 18 degrees Celsius.  One liter of cardioplegia then was administered via the retrograde cannula with additional septal cooling to 11 degrees Celsius.  The following distal anastomoses were performed.  First, a reversed saphenous vein graft was placed end-to-side to the posterior descending branch of the right coronary artery.  This vessel was 1.5 mm in diameter, but only a 1-mm probe would pass distally due to diffuse disease.  The vein was anastomosed end-to-side with a running 7- 0 Prolene suture.  At completion of the anastomosis, all anastomoses were probed proximally and distally to ensure patency before tying the sutures.  Cardioplegia was administered at the completion  of each vein graft to assess flow and hemostasis.  While cardioplegia continued to run into the posterior descending vein, a reversed saphenous vein graft was anastomosed end-to-side to the second diagonal branch of the LAD.  This was approximately 1-mm poor quality target.  The vein was anastomosed end-to-side with a running 7-0 Prolene suture.  The flow was better than expected through this graft and there was good hemostasis at the anastomosis.  Additional cardioplegia  was administered down the veins.  Next, a reversed saphenous vein graft was placed sequentially to the first diagonal and first obtuse marginal.  The first diagonal was a high anterolateral branch (a ramus intermediate equivalent).  It did accept a 1.5- mm probe, but was diffusely diseased.  There also was an intense inflammatory reaction around the vessel and the epicardial fat.  The vein was anastomosed side-to-side to the first diagonal.  The distal end then was beveled and was anastomosed end-to-side to the first obtuse marginal.  This was a 1.5-mm vessel, again diffusely diseased and poor Quality. Again the anastomosis was performed with running 7-0 Prolene suture.  Cardioplegia then was administered down the vein graft. There was good flow.  There was good hemostasis at both the anastomoses of the sequential graft.  Next, the LAD was exposed and the distal LAD was opened, it was a 1.5-mm vessel.  There was diffuse disease, although a probe did pass proximally as well as distally all the way to the apex.  The internal mammary artery was inspected and there was a tight stenosis at approximately the proximal third of the vessel.  The mammary was divided distal to this and the distal segment then was anastomosed as a free graft end-to-side to the LAD with a running 8-0 Prolene suture.  Cardioplegia was administered, but there was no backbleeding from the left internal mammary artery and attempt to pass a probe through the vessel met resistance and then perforated the vessel wall.  This graft then was taken down and a short segment of saphenous vein was anastomosed end-to- side to the distal LAD with a running 7-0 Prolene suture.  An attempt was made to give additional retrograde cardioplegia.  Prior to the LAD anastomosis, another attempt was made and there was no pressure increase.  The retrograde cannula was palpated in the right atrium.  It was readvanced into the coronary sinus,  but it was difficult to advance the cannula beyond just the proximal portion of the coronary sinus and attempts to give retrograde cardioplegia in this position were again unsuccessful.  Additional cardioplegia was administered via the aortic root as well as the vein grafts.  The vein grafts were cut to length. The LAD vein was anastomosed end-to-side to the second diagonal vein as a Y graft.  The cardioplegic cannula then was removed from the ascending Aorta, and the final three proximal anastomoses were performed end-to- side to 4.0 mm punch aortotomies with running 6-0 Prolene sutures.  At the completion of the final proximal anastomosis, the patient was placed in Trendelenburg position.  Lidocaine was administered.  De-airing was performed.  An aortic crossclamp was removed.  Total crossclamp time was 145 minutes.  The patient initially was in heart block.  He did not require defibrillation.  Subsequently, he regained a spontaneous, but bradycardic rhythm.  While rewarming was completed, all proximal and distal anastomoses were inspected for hemostasis.  The epicardial pacing wires were placed on the right ventricle and right atrium.  A DDD pacing was initiated.  The patient was loaded with milrinone and infusion was started at 0.5 mcg/kg/minute.  The dopamine infusion was started at 5 mcg/kg/minute.  The retrograde cardioplegic cannula was removed.  Prior to coming off bypass, it was noted that there was bleeding from the left atrial appendage.  This was oversewn with 4-0 Prolene pledgeted suture with good hemostasis.  When the patient was rewarmed to a core temperature of 37 degrees Celsius, he was weaned from cardiopulmonary bypass on the first attempt. He was DDD paced and on the drips as previously mentioned.  The A-line was dampened and had a poor waveform, but cuff pressures were adequate and cardiac output was sufficient with a cardiac index of greater than  2 liters/minute/meter squared.  Pulmonary artery pressures had decreased from 70-80 systolic prebypass to 40-60 systolic postbypass. The total bypass time was 236 minutes.  A test dose of protamine was administered and was well tolerated.  The atrial and aortic cannulae were removed.  Remainder of the protamine was given without incident.  At this point, it was noted that there was venous bleeding from posteriorly.  Attempts to access this for direct repair were unsuccessful, it was near the junction of the coronary sinus and the inferior vena cava, but attempts to expose that area would result in hemodynamics instability.  This area was packed with Fibrillar and then MRDF, and pressure was held in this area.  There was still bleeding, but the rate had decreased remarkably by the time the protamine administration was completed.  After the completion of the protamine, platelets were administered for thrombocytopenia.  A left pleural and single mediastinal chest tube were placed through separate subcostal incisions.  The sternum was closed with interrupted heavy gauge double stainless steel wires.  The pectoralis fascia, subcutaneous tissue, and skin were closed in standard fashion.  The patient did tolerate the chest closure well hemodynamically.  After removing the drapes, it was noted that the patient had a diffuse erythematous rash, which was maculopapular in nature.  The Anesthesia Service under the direction of Dr. Laverle Hobby placed a Brachial arterial line to replace the radial line, which was damped and inaccurate.  The patient then was transported from the operating room to the surgical intensive care unit in fair condition.     Salvatore Decent Dorris Fetch, M.D.     SCH/MEDQ  D:  06/30/2011  T:  06/30/2011  Job:  [id number]

## 2011-06-30 NOTE — Interval H&P Note (Signed)
History and Physical Interval Note:  06/30/2011 7:52 AM  [name] [name]  has presented today for surgery, with the diagnosis of Coronary artery disease  The various methods of treatment have been discussed with the patient and family. After consideration of risks, benefits and other options for treatment, the patient has consented to  Procedure(s) (LRB): CORONARY ARTERY BYPASS GRAFTING (CABG) (N/A) as a surgical intervention .  The patients' history has been reviewed, patient examined, no change in status, stable for surgery.  I have reviewed the patients' chart and labs.  Questions were answered to the patient's satisfaction.     [name] C

## 2011-06-30 NOTE — Brief Op Note (Addendum)
06/26/2011 - 06/30/2011  1:19 PM  PATIENT:  [name] [name]  66 y.o. male  PRE-OPERATIVE DIAGNOSIS:  Coronary artery disease  POST-OPERATIVE DIAGNOSIS:  Coronary artery disease  PROCEDURE:  Procedure(s) (LRB):  CORONARY ARTERY BYPASS GRAFTING x5   SVG to LAD (LIMA poor quality unusable due to placque)  Sequential SVG to Diagonal 1 and OM  SVG to Diagonal 2  SVG to PDA  ENDOSCOPIC SAPHENOUS VEIN HARVEST RIGHT LEG and LEFT THIGH  SURGEON:  Surgeon(s) and Role:    * Loreli Slot, MD - Primary  PHYSICIAN ASSISTANT: 1. Coral Ceo PA-C 2. Erin Barrett PA-C  ANESTHESIA:   general  EBL:  Total I/O In: 1300 [I.V.:1300] Out: 1375 [Urine:1375]  DRAINS: Left chest tube, mediastinal drains   LOCAL MEDICATIONS USED:  NONE  SPECIMEN:  No Specimen  DISPOSITION OF SPECIMEN:  N/A  COUNTS:  YES  TOURNIQUET:  * No tourniquets in log *  DICTATION: .Dragon Dictation  PLAN OF CARE: Admit to inpatient   PATIENT DISPOSITION:  ICU - intubated and hemodynamically stable.   Delay start of Pharmacological VTE agent (>24hrs) due to surgical blood loss or risk of bleeding: yes  XC= 145 min CPB= 235 min  Vein fair quality LIMA unusable as pedicle or free graft Poor targets LV hypertrophy Severe LV dysfunction Developed rash during procedure

## 2011-06-30 NOTE — Preoperative (Signed)
Beta Blockers   Reason not to administer Beta Blockers:Carvedilol due at 8:00 a.m. today; to PACU/HA at 0645;  to evaluate need for beta blocker intraop.

## 2011-06-30 NOTE — Progress Notes (Signed)
Discussed plan for [name] [name] in regards to ventilatory support for tonight with Dr. Donata Clay on evening rounds. Will monitor and wait for bleeding to decrease to within expected limits of <100cc/h for a few hours and then attempt to wean his PEEP from 10-8-5 by the morning. Will wean FiO2 as able to maintain POx>93% and PO2>60. No plans to wean to extubate tonight.

## 2011-06-30 NOTE — Progress Notes (Signed)
UR Completed Terrill Alperin Graves-Bigelow, RN,BSN [phone]  

## 2011-06-30 NOTE — Transfer of Care (Signed)
Immediate Anesthesia Transfer of Care Note  Patient: [name] [name]  Procedure(s) Performed: Procedure(s) (LRB): CORONARY ARTERY BYPASS GRAFTING (CABG) (N/A)  Patient Location: SICU  Anesthesia Type: General  Level of Consciousness: sedated and unresponsive  Airway & Oxygen Therapy: Patient remains intubated per anesthesia plan and Patient placed on Ventilator (see vital sign flow sheet for setting)  Post-op Assessment: Post -op Vital signs reviewed and stable and report given to SICU RN and RT. Post vital signs: Reviewed and stable  Complications: adverse drug reaction? Allergic reaction to unknown entity coinciding with separation from CPB.

## 2011-06-30 NOTE — H&P (Signed)
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 Cristal Deer McAlhany/ Dr. Harold Hedge  [name] [name] is an 66 y.o. male.  HPI: 66 yo WM presents with cc/o shortness of breath and leg swelling.  [name] [name] has noted shortness of breath exertion for about 2 years. He is not a very precise historian.  He rarely sees a doctor, but had no known history of CAD or COPD. He states that when he first noted the SOB it would only be when he was walking up an incline or stairs. About 6-8 weeks ago he noted the SOB would occur with much less exertion and it would take him longer to recover. He finally sought medical attention when he began noticing swelling in his legs. He was started on medications for his blood pressure. He had a stress test which showed anteroapical and inferolateral defects (report not available, info obtained from notes). He then had cardiac catheterization 06/24/11. It showed severe left main and 3 vessel CAD and markedly impaired LV function with an EF of 20- 25%.  He was also found to have a 75- 95% left ICA stenosis by ultrasound. When asked about possible amaurosis fugax he says he has noted some visual difficulty, which usually occurs before breakfast. This does not sound like classic amaurosis, but it is difficult to tell for sure. He does have claudication with calf pain when he walks.  Past Medical History   Diagnosis  Date   .  Hyperlipidemia    .  Hypertension    Hypothyroidism  No past surgical history on file.  Family History   Problem  Relation  Age of Onset   .  Heart disease  Mother  4   .  Hypertension  Brother    .  Arrhythmia  Brother     Social History: reports that he has been smoking Cigarettes. He has a 50 pack-year smoking history. He does not have any smokeless tobacco history on file. He reports that he does not drink alcohol or use illicit drugs.  Allergies: No Known Allergies  Medications:    Prior to Admission:  Prescriptions prior to admission   Medication  Sig  Dispense  Refill   .  atorvastatin (LIPITOR) 20 MG tablet  Take 20 mg by mouth at bedtime.     .  carvedilol (COREG) 6.25 MG tablet  Take 6.25 mg by mouth 2 (two) times daily with a meal.     .  furosemide (LASIX) 40 MG tablet  Take 40 mg by mouth daily.     Marland Kitchen  levothyroxine (SYNTHROID, LEVOTHROID) 50 MCG tablet  Take 50 mcg by mouth daily.     Marland Kitchen  lisinopril (PRINIVIL,ZESTRIL) 5 MG tablet  Take 5 mg by mouth daily.     .  potassium chloride SA (K-DUR,KLOR-CON) 20 MEQ tablet  Take 20 mEq by mouth daily.      Results for orders placed during the hospital encounter of 06/26/11 (from the past 48 hour(s))   CARDIAC PANEL(CRET KIN+CKTOT+MB+TROPI) Status: Normal (Preliminary result)    Collection Time    06/26/11 4:00 PM   Component  Value  Range  Comment    Total CK  PENDING  7 - 232 (U/L)     CK, MB  2.4  0.3 - 4.0 (ng/mL)     Troponin I  <0.30  <0.30 (ng/mL)     Relative Index  PENDING  0.0 - 2.5  X-ray Chest Pa And Lateral  06/26/2011 *RADIOLOGY REPORT* Clinical Data: Shortness of breath, hypertension, smoker CHEST - 2 VIEW Comparison: None Findings: Enlargement of cardiac silhouette with pulmonary vascular congestion. Minimally tortuous thoracic aorta. No gross failure or consolidation. Tiny left pleural effusion. No pneumothorax or acute osseous findings. IMPRESSION: Enlargement of cardiac silhouette with pulmonary vascular congestion. Tiny left pleural effusion. No acute infiltrate. Original Report Authenticated By: Lollie Marrow, M.D.   Review of Systems  Constitutional: Positive for malaise/fatigue. Negative for fever and chills.  Respiratory: Positive for cough and shortness of breath. Negative for hemoptysis and wheezing.  Cardiovascular: Positive for orthopnea and claudication. Negative for chest pain.  Gastrointestinal: Negative.  Genitourinary: Negative.  Musculoskeletal: Positive for myalgias.   Neurological:  Nonspecific visual changes while driving, does not seem localized to one eye  Endo/Heme/Allergies: Does not bruise/bleed easily.  All other systems reviewed and are negative.   Blood pressure 139/90, pulse 62, temperature 97.5 F (36.4 C), temperature source Oral, resp. rate 18, height 6' (1.829 m), weight 153 lb (69.4 kg), SpO2 100.00%.  Physical Exam  Vitals reviewed.  Constitutional: He is oriented to person, place, and time. He appears well-developed and well-nourished. No distress.  HENT:  Head: Normocephalic and atraumatic.  Eyes: EOM are normal. Pupils are equal, round, and reactive to light.  Neck: Neck supple. No thyromegaly present.  + carotid bruits bilaterally, right > left  Cardiovascular: Normal rate and regular rhythm. Exam reveals gallop (+ S4).  No murmur heard.  Respiratory: Effort normal. He has no wheezes. He has no rales.  Diminished BS bilaterally  GI: Soft. There is no tenderness.  Lymphadenopathy:  He has no cervical adenopathy.  Neurological: He is alert and oriented to person, place, and time. No cranial nerve deficit.  No focal motor deficits  Skin: Skin is warm and dry.   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 He also has acute on chronic systolic CHF. In addition he has a tight left ICA stenosis and severe iliac disease bilaterally.  CABG would be high risk in his case with a mortality in the 5- 10% range. He also would be at high risk for stroke or other ischemic complications given his diffuse vasculopathy.  I have discussed with the patient the general nature of the CABG, the need for general anesthesia, and incisions that are used. I have discussed the expected hospital stay,  overall recovery and short and long term outcomes. He understands the risks include but are not limited to death, stroke, MI, DVT/PE, bleeding, possible need for transfusion, infections, and other organ system dysfunction including respiratory, renal, or GI complications. He understands he would be a very high risk candidate.  It would be nice to assess myocardial viablity prior to CABG, will discuss feasability with Cardiology.  He will be seen by [facility] re: his ICA stenoses  Shepard Keltz C  06/26/2011, 5:14 PM

## 2011-07-01 ENCOUNTER — Inpatient Hospital Stay (HOSPITAL_COMMUNITY): Payer: Medicare HMO

## 2011-07-01 LAB — PREPARE CRYOPRECIPITATE
Unit division: 0
Unit division: 0
Unit division: 0
Unit division: 0
Unit division: 0
Unit division: 0
Unit division: 0
Unit division: 0

## 2011-07-01 LAB — POCT I-STAT 3, ART BLOOD GAS (G3+)
Acid-Base Excess: 1 mmol/L (ref 0.0–2.0)
Acid-base deficit: 1 mmol/L (ref 0.0–2.0)
Bicarbonate: 24.6 mEq/L — ABNORMAL HIGH (ref 20.0–24.0)
Patient temperature: 37.8
Patient temperature: 38.6
pH, Arterial: 7.426 (ref 7.350–7.450)
pO2, Arterial: 70 mmHg — ABNORMAL LOW (ref 80.0–100.0)

## 2011-07-01 LAB — DIC (DISSEMINATED INTRAVASCULAR COAGULATION)PANEL
D-Dimer, Quant: 3.65 ug/mL-FEU — ABNORMAL HIGH (ref 0.00–0.48)
Fibrinogen: 201 mg/dL — ABNORMAL LOW (ref 204–475)
INR: 1.4 (ref 0.00–1.49)
Platelets: 121 10*3/uL — ABNORMAL LOW (ref 150–400)
Prothrombin Time: 17.4 seconds — ABNORMAL HIGH (ref 11.6–15.2)
Smear Review: NONE SEEN
aPTT: 38 seconds — ABNORMAL HIGH (ref 24–37)

## 2011-07-01 LAB — PREPARE FRESH FROZEN PLASMA: Unit division: 0

## 2011-07-01 LAB — BASIC METABOLIC PANEL
BUN: 14 mg/dL (ref 6–23)
CO2: 25 mEq/L (ref 19–32)
Calcium: 8 mg/dL — ABNORMAL LOW (ref 8.4–10.5)
GFR calc non Af Amer: 58 mL/min — ABNORMAL LOW (ref >90)
Glucose, Bld: 112 mg/dL — ABNORMAL HIGH (ref 70–99)
Potassium: 4.1 mEq/L (ref 3.5–5.1)
Sodium: 137 mEq/L (ref 135–145)

## 2011-07-01 LAB — POCT I-STAT, CHEM 8
Calcium, Ion: 1.12 mmol/L (ref 1.12–1.32)
Calcium, Ion: 1.21 mmol/L (ref 1.12–1.32)
Glucose, Bld: 116 mg/dL — ABNORMAL HIGH (ref 70–99)
HCT: 30 % — ABNORMAL LOW (ref 39.0–52.0)
HCT: 24 % — ABNORMAL LOW (ref 39.0–52.0)
Hemoglobin: 8.2 g/dL — ABNORMAL LOW (ref 13.0–17.0)
TCO2: 24 mmol/L (ref 0–100)
TCO2: 23 mmol/L (ref 0–100)

## 2011-07-01 LAB — CBC
HCT: 27.9 % — ABNORMAL LOW (ref 39.0–52.0)
HCT: 27.7 % — ABNORMAL LOW (ref 39.0–52.0)
HCT: 24.3 % — ABNORMAL LOW (ref 39.0–52.0)
Hemoglobin: 9.7 g/dL — ABNORMAL LOW (ref 13.0–17.0)
Hemoglobin: 9.5 g/dL — ABNORMAL LOW (ref 13.0–17.0)
MCH: 30.6 pg (ref 26.0–34.0)
MCHC: 34.8 g/dL (ref 30.0–36.0)
MCHC: 34.3 g/dL (ref 30.0–36.0)
Platelets: 115 10*3/uL — ABNORMAL LOW (ref 150–400)
RBC: 3.1 MIL/uL — ABNORMAL LOW (ref 4.22–5.81)
RDW: 15.7 % — ABNORMAL HIGH (ref 11.5–15.5)
RDW: 16.4 % — ABNORMAL HIGH (ref 11.5–15.5)
WBC: 12.4 10*3/uL — ABNORMAL HIGH (ref 4.0–10.5)
WBC: 17.1 10*3/uL — ABNORMAL HIGH (ref 4.0–10.5)

## 2011-07-01 LAB — GLUCOSE, CAPILLARY
Glucose-Capillary: 116 mg/dL — ABNORMAL HIGH (ref 70–99)
Glucose-Capillary: 109 mg/dL — ABNORMAL HIGH (ref 70–99)
Glucose-Capillary: 155 mg/dL — ABNORMAL HIGH (ref 70–99)

## 2011-07-01 LAB — MAGNESIUM: Magnesium: 2.3 mg/dL (ref 1.5–2.5)

## 2011-07-01 LAB — PREPARE PLATELET PHERESIS
Unit division: 0
Unit division: 0

## 2011-07-01 LAB — CREATININE, SERUM
GFR calc Af Amer: 45 mL/min — ABNORMAL LOW (ref >90)
GFR calc non Af Amer: 39 mL/min — ABNORMAL LOW (ref >90)

## 2011-07-01 LAB — PLATELET COUNT: Platelets: 101 10*3/uL — ABNORMAL LOW (ref 150–400)

## 2011-07-01 MED ORDER — FUROSEMIDE 10 MG/ML IJ SOLN
10.0000 mg/h | INTRAVENOUS | Status: DC
  Administered 2011-07-01: 8 mg/h via INTRAVENOUS
  Administered 2011-07-02 – 2011-07-03 (×2): 10 mg/h via INTRAVENOUS
  Filled 2011-07-01 (×8): qty 25

## 2011-07-01 MED ORDER — POTASSIUM CHLORIDE 10 MEQ/50ML IV SOLN
10.0000 meq | INTRAVENOUS | Status: AC
  Administered 2011-07-01 (×2): 10 meq via INTRAVENOUS

## 2011-07-01 MED ORDER — CALCIUM CHLORIDE 10 % IV SOLN
1.0000 g | Freq: Once | INTRAVENOUS | Status: AC
  Administered 2011-07-01: 1 g via INTRAVENOUS

## 2011-07-01 MED ORDER — ALBUMIN HUMAN 5 % IV SOLN
12.5000 g | Freq: Once | INTRAVENOUS | Status: AC
  Administered 2011-07-01: 12.5 g via INTRAVENOUS

## 2011-07-01 MED ORDER — INSULIN ASPART 100 UNIT/ML ~~LOC~~ SOLN: 0.0000 [IU] | SUBCUTANEOUS | Status: DC

## 2011-07-01 MED ORDER — CARVEDILOL 3.125 MG PO TABS
3.1250 mg | ORAL_TABLET | Freq: Two times a day (BID) | ORAL | Status: DC
  Administered 2011-07-01 – 2011-07-10 (×13): 3.125 mg via ORAL
  Filled 2011-07-01 (×21): qty 1

## 2011-07-01 MED FILL — Dexmedetomidine HCl IV Soln 200 MCG/2ML: INTRAVENOUS | Qty: 2 | Status: AC

## 2011-07-01 MED FILL — Magnesium Sulfate Inj 50%: INTRAMUSCULAR | Qty: 10 | Status: AC

## 2011-07-01 MED FILL — Potassium Chloride Inj 2 mEq/ML: INTRAVENOUS | Qty: 40 | Status: AC

## 2011-07-01 NOTE — Progress Notes (Signed)
1 Day Post-Op Procedure(s) (LRB): CORONARY ARTERY BYPASS GRAFTING (CABG) (N/A) Subjective: Intubated Follows commands, c/o pain left chest  Objective: Vital signs in last 24 hours: Temp:  [95.4 F (35.2 C)-101.5 F (38.6 C)] 99.9 F (37.7 C) (06/12 0800) Pulse Rate:  [67-90] 67  (06/12 0800) Cardiac Rhythm:  [-] Normal sinus rhythm (06/12 0800) Resp:  [10-19] 19  (06/12 0800) BP: (79-146)/(45-69) 91/46 mmHg (06/12 0800) SpO2:  [93 %-100 %] 97 % (06/12 0800) Arterial Line BP: (91-153)/(46-69) 102/49 mmHg (06/12 0800) FiO2 (%):  [40 %-70.5 %] 40 % (06/12 0800) Weight:  [173 lb 8 oz (78.7 kg)] 173 lb 8 oz (78.7 kg) (06/12 0500)  Hemodynamic parameters for last 24 hours: PAP: (41-68)/(20-38) 55/27 mmHg CO:  [2.5 L/min-5.1 L/min] 4.2 L/min CI:  [1.5 L/min/m2-3 L/min/m2] 2.5 L/min/m2  Intake/Output from previous day: 06/11 0701 - 06/12 0700 In: 11622.3 [I.V.:6203.3; ZOXWR:6045; NG/GT:184; IV Piggyback:1701] Out: 9015 [Urine:4370; Emesis/NG output:400; Blood:2435; Chest Tube:1810] Intake/Output this shift: Total I/O In: 181.2 [I.V.:131.2; NG/GT:50] Out: 95 [Urine:45; Chest Tube:50]  General appearance: cooperative Neurologic: intact Heart: regular rate and rhythm Lungs: clear to auscultation bilaterally Abdomen: normal findings: soft, non-tender  Lab Results:  Basename 07/01/11 0400 06/30/11 2200 06/30/11 2150  WBC 13.7* -- 11.2*  HGB 9.5* 10.2* --  HCT 27.7* 30.0* --  PLT 125* -- 118*   BMET:  Basename 07/01/11 0400 06/30/11 2200 06/30/11 0512  NA 137 137 --  K 4.1 4.0 --  CL 104 103 --  CO2 25 -- 24  GLUCOSE 112* 106* --  BUN 14 12 --  CREATININE 1.27 1.10 --  CALCIUM 8.0* -- 9.3    PT/INR:  Basename 06/30/11 2035  LABPROT 17.4*  INR 1.40   ABG    Component Value Date/Time   PHART 7.437 07/01/2011 0359   HCO3 24.1* 07/01/2011 0359   TCO2 25 07/01/2011 0359   ACIDBASEDEF 1.0 06/30/2011 2155   O2SAT 99.0 07/01/2011 0359   CBG (last 3)   Basename  07/01/11 0721 07/01/11 0403 06/30/11 2339  GLUCAP 87 116* 125*    Assessment/Plan: S/P Procedure(s) (LRB): CORONARY ARTERY BYPASS GRAFTING (CABG) (N/A) POD # 1 CABG x 5 CV- severe L main/ 3 VD, severe ischemic cardiomyopathy, acute on chronic systolic and diastolic heart failure  Hemodynamics stable on dopamine @ 4 and milrinone @ 0.33, requiring a little Neo  Will decrease milrinone to 0.15, continue dopamine for CO and to assist with diuresis  Pulmonary hypertension improved post op will watch as milrinone weaned  RESP- remained intubated overnight due to bleeding  wean to extubate  RENAL- lytes, creatinine OK  start lasix gtt for volume overload  Anemia secondary to ABL  Coagulopathy improved  Thrombocytopenia improved  SCD for DVT prophylaxis, will avoid anticoagulants for at least another 24 hours  CBG well controlled   LOS: 5 days    Eulogio Requena C 07/01/2011

## 2011-07-01 NOTE — Plan of Care (Signed)
Problem: Phase II Progression Outcomes Goal: Patient extubated within - Outcome: Progressing Patient requiring 10 cmH2O PEEP and increased FiO2 to maintain adequate oxygenation and in attempt to decrease CT drainage. Ordered not to wean/extubate tonight

## 2011-07-01 NOTE — Procedures (Signed)
Extubation Procedure Note  Patient Details:   Name: [name] [name] DOB: 1946/01/04 MRN: [id number]   Airway Documentation:     Evaluation  O2 sats: stable throughout Complications: No apparent complications Patient did tolerate procedure well. Bilateral Breath Sounds: Clear   Yes  Pt tolerated rapid wean well. Pt achieved around VC, -25 NIF and was positive for cuff leak. RT removed ET tube without difficulty and placed pt on 4lpm [place]. Pt achieved around on IS x 10. RT will continue to monitor.   Parke Poisson 07/01/2011, 10:12 AM

## 2011-07-01 NOTE — Progress Notes (Addendum)
Nutrition Follow-up  Patient s/p CABG x 5; remains intubated and sedated post-op. Lasix for volume overload. OGT in place. Per RN, plan is to extubate today.  Diet Order:  Clear Liquids  Meds: Scheduled Meds:   . acetaminophen (TYLENOL) oral liquid 160 mg/5 mL  650 mg Per Tube NOW   Or  . acetaminophen  650 mg Rectal NOW  . acetaminophen  1,000 mg Oral Q6H   Or  . acetaminophen (TYLENOL) oral liquid 160 mg/5 mL  975 mg Per Tube Q6H  . albumin human  12.5 g Intravenous Once  . aspirin EC  325 mg Oral Daily   Or  . aspirin  324 mg Per Tube Daily  . atorvastatin  20 mg Oral QHS  . bisacodyl  10 mg Oral Daily   Or  . bisacodyl  10 mg Rectal Daily  . calcium chloride  1 g Intravenous Once  . carvedilol  3.125 mg Oral BID WC  . cefUROXime (ZINACEF)  IV  1.5 g Intravenous To OR  . desmopressin (DDAVP) IV  20 mcg Intravenous Once  . dextrose      . docusate sodium  200 mg Oral Daily  . famotidine (PEPCID) IV  20 mg Intravenous Q12H  . furosemide  10 mg Intravenous Once  . insulin aspart  0-24 Units Subcutaneous Q2H   Followed by  . insulin aspart  0-24 Units Subcutaneous Q4H  . insulin regular  0-10 Units Intravenous TID WC  . levothyroxine  50 mcg Oral QAC breakfast  . magnesium sulfate      . magnesium sulfate  4 g Intravenous Once  . moxifloxacin  400 mg Intravenous Q24H  . nitroglycerin-nicardipine-HEPARIN-sodium bicarbonate irrigation for artery spasm   Irrigation To OR  . nitroglycerin-nicardipine-HEPARIN-sodium bicarbonate irrigation for artery spasm   Irrigation To OR  . pantoprazole  40 mg Oral Q1200  . potassium chloride  10 mEq Intravenous Q1 Hr x 3  . potassium chloride  10 mEq Intravenous Q1 Hr x 2  . protamine  25 mg Intravenous Once  . sodium chloride  3 mL Intravenous Q12H  . vancomycin  1,000 mg Intravenous Once  . DISCONTD: aspirin EC  81 mg Oral Daily  . DISCONTD: bisacodyl  5 mg Oral Once  . DISCONTD: carvedilol  12.5 mg Oral BID WC  . DISCONTD: cefUROXime  (ZINACEF)  IV  1.5 g Intravenous Q12H  . DISCONTD: cefUROXime (ZINACEF)  IV  750 mg Intravenous To OR  . DISCONTD: dexmedetomidine (PRECEDEX) IV infusion for high rates  0.1-0.7 mcg/kg/hr Intravenous To OR  . DISCONTD: DOPamine  2-20 mcg/kg/min Intravenous To OR  . DISCONTD: epinephrine  0.5-20 mcg/min Intravenous To OR  . DISCONTD: furosemide  40 mg Oral BID  . DISCONTD: insulin aspart  0-24 Units Subcutaneous Q4H  . DISCONTD: insulin (NOVOLIN-R) infusion   Intravenous To OR  . DISCONTD: levothyroxine  50 mcg Oral Daily  . DISCONTD: lisinopril  10 mg Oral Daily  . DISCONTD: magnesium sulfate  40 mEq Other To OR  . DISCONTD: metoprolol tartrate  12.5 mg Per Tube BID  . DISCONTD: metoprolol tartrate  12.5 mg Oral Once  . DISCONTD: metoprolol tartrate  12.5 mg Oral BID  . DISCONTD: nitroGLYCERIN  2-200 mcg/min Intravenous To OR  . DISCONTD: phenylephrine (NEO-SYNEPHRINE) Adult infusion  30-200 mcg/min Intravenous To OR  . DISCONTD: potassium chloride  80 mEq Other To OR  . DISCONTD: potassium chloride SA  20 mEq Oral Daily  . DISCONTD: sodium  chloride  3 mL Intravenous Q12H  . DISCONTD: tranexamic acid  15 mg/kg Intravenous To OR  . DISCONTD: tranexamic acid  2 mg/kg Intracatheter To OR  . DISCONTD: tranexamic acid (CYKLOKAPRON) infusion ([facility])  1.5 mg/kg/hr Intravenous To OR   Continuous Infusions:   . sodium chloride 20 mL/hr at 07/01/11 0800  . sodium chloride 20 mL/hr at 06/30/11 1600  . sodium chloride    . dexmedetomidine (PRECEDEX) IV infusion Stopped (07/01/11 0800)  . DOPamine 4 mcg/kg/min (07/01/11 0900)  . furosemide (LASIX) infusion    . insulin (NOVOLIN-R) infusion Stopped (06/30/11 1600)  . lactated ringers 20 mL/hr at 07/01/11 0900  . milrinone 0.15 mcg/kg/min (07/01/11 0900)  . nitroGLYCERIN Stopped (07/01/11 0407)  . phenylephrine (NEO-SYNEPHRINE) Adult infusion 10 mcg/min (07/01/11 0900)  . DISCONTD: heparin 1,400 Units/hr (06/29/11 2300)  . DISCONTD: milrinone       PRN Meds:.albumin human, ALPRAZolam, lactated ringers, metoprolol, midazolam, morphine injection, morphine injection, ondansetron (ZOFRAN) IV, oxyCODONE, sodium chloride, DISCONTD: sodium chloride, DISCONTD: 0.9 % irrigation (POUR BTL), DISCONTD: 0.9 % irrigation (POUR BTL), DISCONTD: acetaminophen, DISCONTD: hemostatic agents, DISCONTD: hemostatic agents, DISCONTD: hemostatic agents, DISCONTD: hemostatic agents, DISCONTD: hemostatic agents DISCONTD: hemostatic agents, DISCONTD: nitroGLYCERIN, DISCONTD: ondansetron (ZOFRAN) IV, DISCONTD: sodium chloride, DISCONTD: Surgifoam 1 Gm with 0.9% sodium chloride (4 ml) topical solution, DISCONTD: Surgifoam 1 Gm with 0.9% sodium chloride (4 ml) topical solution, DISCONTD: Surgifoam 1 Gm with 0.9% sodium chloride (4 ml) topical solution, DISCONTD: zolpidem  Labs:  CMP     Component Value Date/Time   NA 137 07/01/2011 0400   K 4.1 07/01/2011 0400   CL 104 07/01/2011 0400   CO2 25 07/01/2011 0400   GLUCOSE 112* 07/01/2011 0400   BUN 14 07/01/2011 0400   CREATININE 1.27 07/01/2011 0400   CALCIUM 8.0* 07/01/2011 0400   PROT 7.1 06/26/2011 1711   ALBUMIN 3.4* 06/26/2011 1711   AST 19 06/26/2011 1711   ALT 15 06/26/2011 1711   ALKPHOS 203* 06/26/2011 1711   BILITOT 1.4* 06/26/2011 1711   GFRNONAA 58* 07/01/2011 0400   GFRAA 67* 07/01/2011 0400     Intake/Output Summary (Last 24 hours) at 07/01/11 0920 Last data filed at 07/01/11 0900  Gross per 24 hour  Intake 12093.75 ml  Output   8935 ml  Net 3158.75 ml    CBG (last 3)   Basename 07/01/11 0721 07/01/11 0403 06/30/11 2339  GLUCAP 87 116* 125*    Weight Status:  78.7 kg (6/12) -- up likely due to volume overload  Re-estimated needs:  1850-1950 kcals, 105-115 gm protein  Nutrition Dx:  Inadequate Oral Intake r/t inability to eat, s/p CABG as evidenced by NPO status, ongoing  Goal:  Initiate EN in next 24 hours if extubation not successful Monitor: EN initiation, respiratory status, weight, labs,  I/O's  Intervention:    If EN started, recommend initiation of Jevity 1.2 formula at 20 ml/hr and increase by 10 ml every 4 hours to goal rate of 60 ml/hr with Prostat liquid protein 30 ml twice daily via tube to provide 1928 total kcals, 110 gm protein, 1162 ml of free water  RD to follow for nutrition care plan  Alger Memos Pager #:  [phone]

## 2011-07-01 NOTE — Progress Notes (Signed)
Patient ID: [name] [name], male   DOB: 1945-04-21, 66 y.o.   MRN: [id number] Comfortable at present  BP 107/55  Pulse 80  Temp 97.5 F (36.4 C) (Core (Comment))  Resp 17  Ht 6' (1.829 m)  Wt 173 lb 8 oz (78.7 kg)  BMI 23.53 kg/m2  SpO2 97%  Milrinone off- PAP back to preop 72/25 On dopamine at 3 Still on Neo  On lasix gtt- no significant diuresis yet  Good cardiac index

## 2011-07-01 NOTE — Anesthesia Postprocedure Evaluation (Signed)
  Anesthesia Post-op Note  Patient: [name] [name]  Procedure(s) Performed: Procedure(s) (LRB): CORONARY ARTERY BYPASS GRAFTING (CABG) (N/A)  Patient Location: SICU  Anesthesia Type: General  Level of Consciousness: sedated, unresponsive and Patient remains intubated per anesthesia plan  Airway and Oxygen Therapy: Patient remains intubated per anesthesia plan and Patient placed on Ventilator (see vital sign flow sheet for setting)  Post-op Pain: none  Post-op Assessment: Post-op Vital signs reviewed, Patient's Cardiovascular Status Stable, Respiratory Function Stable, Patent Airway, No signs of Nausea or vomiting and Pain level controlled  Post-op Vital Signs: stable  Complications: No apparent anesthesia complications

## 2011-07-02 ENCOUNTER — Inpatient Hospital Stay (HOSPITAL_COMMUNITY): Payer: Medicare HMO

## 2011-07-02 ENCOUNTER — Encounter (HOSPITAL_COMMUNITY): Payer: Self-pay | Admitting: Thoracic Surgery (Cardiothoracic Vascular Surgery)

## 2011-07-02 LAB — CBC
HCT: 25.4 % — ABNORMAL LOW (ref 39.0–52.0)
MCH: 31.7 pg (ref 26.0–34.0)
MCHC: 35 g/dL (ref 30.0–36.0)
MCV: 90.4 fL (ref 78.0–100.0)
Platelets: 113 10*3/uL — ABNORMAL LOW (ref 150–400)
RDW: 16.5 % — ABNORMAL HIGH (ref 11.5–15.5)
WBC: 20.6 10*3/uL — ABNORMAL HIGH (ref 4.0–10.5)

## 2011-07-02 LAB — BASIC METABOLIC PANEL
BUN: 25 mg/dL — ABNORMAL HIGH (ref 6–23)
CO2: 25 mEq/L (ref 19–32)
Calcium: 8.3 mg/dL — ABNORMAL LOW (ref 8.4–10.5)
Chloride: 97 mEq/L (ref 96–112)
Creatinine, Ser: 2.09 mg/dL — ABNORMAL HIGH (ref 0.50–1.35)

## 2011-07-02 LAB — GLUCOSE, CAPILLARY
Glucose-Capillary: 101 mg/dL — ABNORMAL HIGH (ref 70–99)
Glucose-Capillary: 105 mg/dL — ABNORMAL HIGH (ref 70–99)
Glucose-Capillary: 104 mg/dL — ABNORMAL HIGH (ref 70–99)

## 2011-07-02 LAB — PREPARE RBC (CROSSMATCH)

## 2011-07-02 MED ORDER — INSULIN ASPART 100 UNIT/ML ~~LOC~~ SOLN: 0.0000 [IU] | Freq: Three times a day (TID) | SUBCUTANEOUS | Status: DC

## 2011-07-02 MED FILL — Mannitol IV Soln 20%: INTRAVENOUS | Qty: 500 | Status: AC

## 2011-07-02 MED FILL — Lidocaine HCl IV Inj 20 MG/ML: INTRAVENOUS | Qty: 5 | Status: AC

## 2011-07-02 MED FILL — Sodium Chloride Irrigation Soln 0.9%: Qty: 3000 | Status: AC

## 2011-07-02 MED FILL — Sodium Bicarbonate IV Soln 8.4%: INTRAVENOUS | Qty: 50 | Status: AC

## 2011-07-02 MED FILL — Calcium Chloride Inj 10%: INTRAVENOUS | Qty: 10 | Status: AC

## 2011-07-02 MED FILL — Sodium Chloride IV Soln 0.9%: INTRAVENOUS | Qty: 1000 | Status: AC

## 2011-07-02 MED FILL — Heparin Sodium (Porcine) Inj 1000 Unit/ML: INTRAMUSCULAR | Qty: 30 | Status: AC

## 2011-07-02 MED FILL — Heparin Sodium (Porcine) Inj 1000 Unit/ML: INTRAMUSCULAR | Qty: 20 | Status: AC

## 2011-07-02 MED FILL — Electrolyte-R (PH 7.4) Solution: INTRAVENOUS | Qty: 4000 | Status: AC

## 2011-07-02 NOTE — Progress Notes (Signed)
2 Days Post-Op Procedure(s) (LRB): CORONARY ARTERY BYPASS GRAFTING (CABG) (N/A) Subjective: Some pain left side of chest, well tolerated No nausea  Objective: Vital signs in last 24 hours: Temp:  [97.3 F (36.3 C)-99.7 F (37.6 C)] 97.7 F (36.5 C) (06/13 0745) Pulse Rate:  [64-84] 80  (06/13 0700) Cardiac Rhythm:  [-] Atrial paced (06/13 0700) Resp:  [9-27] 11  (06/13 0700) BP: (88-127)/(39-74) 98/51 mmHg (06/13 0700) SpO2:  [92 %-100 %] 95 % (06/13 0700) Arterial Line BP: (90-143)/(36-57) 99/45 mmHg (06/13 0700) FiO2 (%):  [39.8 %-40.1 %] 40.1 % (06/12 0945) Weight:  [179 lb 7.3 oz (81.4 kg)] 179 lb 7.3 oz (81.4 kg) (06/13 0500)  Hemodynamic parameters for last 24 hours: PAP: (48-88)/(17-33) 76/27 mmHg CO:  [4.2 L/min-4.7 L/min] 4.4 L/min CI:  [2.5 L/min/m2-2.7 L/min/m2] 2.6 L/min/m2  Intake/Output from previous day: 06/12 0701 - 06/13 0700 In: 2896.4 [P.O.:300; I.V.:1946.4; NG/GT:50; IV Piggyback:600] Out: 1945 [Urine:965; Chest Tube:980] Intake/Output this shift:    General appearance: alert and no distress Neurologic: intact Heart: regular rate and rhythm Lungs: diminished breath sounds bibasilar Abdomen: normal findings: soft, non-tender  Lab Results:  Basename 07/02/11 0359 07/01/11 1705 07/01/11 1700  WBC 20.6* -- 17.1*  HGB 8.9* 8.2* --  HCT 25.4* 24.0* --  PLT 113* -- 115*   BMET:  Basename 07/02/11 0359 07/01/11 1705 07/01/11 0400  NA 132* 136 --  K 4.5 4.4 --  CL 97 103 --  CO2 25 -- 25  GLUCOSE 110* 116* --  BUN 25* 20 --  CREATININE 2.09* 1.60* --  CALCIUM 8.3* -- 8.0*    PT/INR:  Basename 06/30/11 2035  LABPROT 17.4*  INR 1.40   ABG    Component Value Date/Time   PHART 7.426 07/01/2011 0948   HCO3 24.6* 07/01/2011 0948   TCO2 23 07/01/2011 1705   ACIDBASEDEF 1.0 06/30/2011 2155   O2SAT 98.0 07/01/2011 0948   CBG (last 3)   Basename 07/02/11 0400 07/01/11 2339 07/01/11 1952  GLUCAP 101* 111* 155*    Assessment/Plan: S/P  Procedure(s) (LRB): CORONARY ARTERY BYPASS GRAFTING (CABG) (N/A) POD # 2  CV- maintaining a good cardiac index off milrinone. PA pressures are back to baseline in 70-80 systolic range. D/c swan Continue atrial pacing to support BP  RENAL- creatinine up - acute renal failure, he has maintained good cardiac output so I suspect this is primarily a BP issue. He has severe diffuse ASCVD and I suspect has renal artery stenosis- will try to maintain BP of at least 110 systolic and preferably higher. Continue lasix gtt.  RESP- relatively stable, continue pulmonary hygiene  ANEMIA- hct 25, but with rising creatinine and BP issues would benefit from transfusion.  SCD for DVT prophylaxis  OOB  D/C CT   LOS: 6 days    Tiffiny Worthy C 07/02/2011

## 2011-07-02 NOTE — Progress Notes (Addendum)
Clinical Social Work Department BRIEF PSYCHOSOCIAL ASSESSMENT 07/02/2011  Patient:  [name] [name], [name] [name]     Account Number:  [id number]     Admit date:  06/26/2011  Clinical Social Worker:  Mee Hives  Date/Time:  07/02/2011 05:00 PM  Referred by:  Care Management  Date Referred:  07/02/2011 Referred for  SNF Placement   Other Referral:   Interview type:  Patient Other interview type:    PSYCHOSOCIAL DATA Living Status:  ALONE Admitted from facility:   Level of care:   Primary support name:  Yetta Flock Primary support relationship to patient:  CHILD, ADULT Degree of support available:   Moderate    CURRENT CONCERNS Current Concerns  Post-Acute Placement   Other Concerns:    SOCIAL WORK ASSESSMENT / PLAN Pt agreeable to SNF at d/c, due to need for caregiver assistance. CSW will initiate SNF search in [facility] and will follow to facilitate d/c planning as pt progresses.   Assessment/plan status:  Information/Referral to [facility] Other assessment/ plan:   Information/referral to community resources:   SNF at time of d/c    PATIENT'S/FAMILY'S RESPONSE TO PLAN OF CARE: Pt agreeable

## 2011-07-02 NOTE — Progress Notes (Signed)
Pt is s/p CABG. He is slowly recovering. BP is stable. He will need to follow up with Dr. Lady Gary his cardiologist in [place] after discharge.   [name] [name] 11:13 AM 07/02/2011

## 2011-07-02 NOTE — Progress Notes (Signed)
Patient ID: [name] [name], male   DOB: 1945/09/08, 66 y.o.   MRN: [id number]                   [address]            [place] [address]          [phone]     2 Days Post-Op Procedure(s) (LRB): CORONARY ARTERY BYPASS GRAFTING (CABG) (N/A)  Total Length of Stay:  LOS: 6 days  BP 103/65  Pulse 91  Temp 98 F (36.7 C) (Oral)  Resp 15  Ht 6' (1.829 m)  Wt 179 lb 7.3 oz (81.4 kg)  BMI 24.34 kg/m2  SpO2 95%     . sodium chloride 20 mL/hr at 07/01/11 2349  . sodium chloride 20 mL/hr at 06/30/11 1600  . sodium chloride    . dexmedetomidine (PRECEDEX) IV infusion Stopped (07/01/11 0800)  . DOPamine 4 mcg/kg/min (07/02/11 1900)  . furosemide (LASIX) infusion 10 mg/hr (07/02/11 1900)  . insulin (NOVOLIN-R) infusion Stopped (06/30/11 1600)  . lactated ringers 20 mL/hr at 07/02/11 1900  . nitroGLYCERIN Stopped (07/01/11 0407)  . phenylephrine (NEO-SYNEPHRINE) Adult infusion 30 mcg/min (07/02/11 1900)     Lab Results  Component Value Date   WBC 20.6* 07/02/2011   HGB 8.9* 07/02/2011   HCT 25.4* 07/02/2011   PLT 113* 07/02/2011   GLUCOSE 110* 07/02/2011   ALT 15 06/26/2011   AST 19 06/26/2011   NA 132* 07/02/2011   K 4.5 07/02/2011   CL 97 07/02/2011   CREATININE 2.09* 07/02/2011   BUN 25* 07/02/2011   CO2 25 07/02/2011   INR 1.40 06/30/2011   HGBA1C 5.9* 06/26/2011   Up in chair, weak, still on neo and dopamine  Delight Ovens MD  Beeper [phone] Office [phone] 07/02/2011 7:10 PM

## 2011-07-03 ENCOUNTER — Inpatient Hospital Stay (HOSPITAL_COMMUNITY): Payer: Medicare HMO

## 2011-07-03 LAB — TYPE AND SCREEN: ABO/RH(D): O POS

## 2011-07-03 LAB — COMPREHENSIVE METABOLIC PANEL
ALT: 13 U/L (ref 0–53)
AST: 27 U/L (ref 0–37)
Calcium: 8.2 mg/dL — ABNORMAL LOW (ref 8.4–10.5)
Creatinine, Ser: 2.56 mg/dL — ABNORMAL HIGH (ref 0.50–1.35)
Sodium: 129 mEq/L — ABNORMAL LOW (ref 135–145)
Total Protein: 5.3 g/dL — ABNORMAL LOW (ref 6.0–8.3)

## 2011-07-03 LAB — CBC
MCH: 30.4 pg (ref 26.0–34.0)
MCHC: 34.2 g/dL (ref 30.0–36.0)
MCV: 88.9 fL (ref 78.0–100.0)
Platelets: 114 10*3/uL — ABNORMAL LOW (ref 150–400)
RDW: 16.1 % — ABNORMAL HIGH (ref 11.5–15.5)

## 2011-07-03 LAB — GLUCOSE, CAPILLARY: Glucose-Capillary: 83 mg/dL (ref 70–99)

## 2011-07-03 MED ORDER — DEXTROSE 5 % IV SOLN
500.0000 mg | Freq: Once | INTRAVENOUS | Status: AC
  Administered 2011-07-03: 500 mg via INTRAVENOUS
  Filled 2011-07-03: qty 500

## 2011-07-03 NOTE — Progress Notes (Signed)
Clinical Social Work Department CLINICAL SOCIAL WORK PLACEMENT NOTE 07/03/2011  Patient:  [name] [name], [name] [name]  Account Number:  [id number] Admit date:  06/26/2011  Clinical Social Worker:  Baxter Flattery, LCSWA  Date/time:  07/03/2011 11:30 AM  Clinical Social Work is seeking post-discharge placement for this patient at the following level of care:   SKILLED NURSING   (*CSW will update this form in Epic as items are completed)   07/03/2011  Patient/family provided with [facility] Department of Clinical Social Work's list of facilities offering this level of care within the geographic area requested by the patient (or if unable, by the patient's family).  07/03/2011  Patient/family informed of their freedom to choose among providers that offer the needed level of care, that participate in Medicare, Medicaid or managed care program needed by the patient, have an available bed and are willing to accept the patient.  07/03/2011  Patient/family informed of [facility]' ownership interest in [facility], as well as of the fact that they are under no obligation to receive care at this facility.  PASARR submitted to EDS on 07/03/2011 PASARR number received from EDS on   FL2 transmitted to all facilities in geographic area requested by pt/family on  07/03/2011 FL2 transmitted to all facilities within larger geographic area on   Patient informed that his/her managed care company has contracts with or will negotiate with  certain facilities, including the following:     Patient/family informed of bed offers received:   Patient chooses bed at  Physician recommends and patient chooses bed at    Patient to be transferred to  on   Patient to be transferred to facility by   The following physician request were entered in Epic:   Additional Comments: Pref [place], pt lives in [place]

## 2011-07-03 NOTE — Consult Note (Signed)
Pt was a 1 ppd smoker but he quit on his own on 06/23/11. He has remained tobacco free since. Congratulated and encouraged pt to remain tobacco free. Discussed relapse prevention strategies. Referred to 1-800 quit now for f/u and support. Discussed oral fixation substitutes, second hand smoke and in home smoking policy. Reviewed and gave pt Written education/contact information.

## 2011-07-03 NOTE — Progress Notes (Signed)
TCTS BRIEF SICU PROGRESS NOTE  3 Days Post-Op  S/P Procedure(s) (LRB): CORONARY ARTERY BYPASS GRAFTING (CABG) (N/A)   Stable day NSR BP stable Excellent UOP  Plan: Continue current plan  Lexiana Spindel H 07/03/2011 6:11 PM

## 2011-07-03 NOTE — Progress Notes (Signed)
3 Days Post-Op Procedure(s) (LRB): CORONARY ARTERY BYPASS GRAFTING (CABG) (N/A) Subjective: "feel about the same" SOB with minimal activity  Objective: Vital signs in last 24 hours: Temp:  [96.7 F (35.9 C)-98 F (36.7 C)] 96.7 F (35.9 C) (06/14 0734) Pulse Rate:  [88-91] 89  (06/14 0700) Cardiac Rhythm:  [-] Normal sinus rhythm (06/14 0400) Resp:  [7-18] 14  (06/14 0700) BP: (89-148)/(50-98) 140/59 mmHg (06/14 0700) SpO2:  [93 %-100 %] 93 % (06/14 0700) Arterial Line BP: (93-167)/(45-135) 143/58 mmHg (06/14 0700) Weight:  [181 lb 3.5 oz (82.2 kg)] 181 lb 3.5 oz (82.2 kg) (06/14 0500)  Hemodynamic parameters for last 24 hours: PAP: (82)/(34-35) 82/34 mmHg  Intake/Output from previous day: 06/13 0701 - 06/14 0700 In: 1703.7 [I.V.:1423.7; Blood:278; IV Piggyback:2] Out: 1075 [Urine:975; Chest Tube:100] Intake/Output this shift:    General appearance: alert and no distress Neurologic: intact Heart: regular rate and rhythm Lungs: diminished breath sounds bibasilar Abdomen: normal findings: soft, non-tender  Lab Results:  Basename 07/03/11 0423 07/02/11 0359  WBC 17.7* 20.6*  HGB 9.3* 8.9*  HCT 27.2* 25.4*  PLT 114* 113*   BMET:  Basename 07/03/11 0423 07/02/11 0359  NA 129* 132*  K 4.4 4.5  CL 95* 97  CO2 21 25  GLUCOSE 98 110*  BUN 35* 25*  CREATININE 2.56* 2.09*  CALCIUM 8.2* 8.3*    PT/INR:  Basename 06/30/11 2035  LABPROT 17.4*  INR 1.40   ABG    Component Value Date/Time   PHART 7.426 07/01/2011 0948   HCO3 24.6* 07/01/2011 0948   TCO2 23 07/01/2011 1705   ACIDBASEDEF 1.0 06/30/2011 2155   O2SAT 98.0 07/01/2011 0948   CBG (last 3)   Basename 07/03/11 0346 07/02/11 2347 07/02/11 1941  GLUCAP 92 97 92    Assessment/Plan: S/P Procedure(s) (LRB): CORONARY ARTERY BYPASS GRAFTING (CABG) (N/A) POD # 3 CABG x 5 CV- Left main and 3 vessel CAD, diffusely diseased vessels, presented with decompensated acute on chronic systolic and diastolic HF. Severe  ischemic cardiomyopathy with EF 20-25%.  Still paced to maintain CO/ BP  Off Neo  Will continue dopamine for now to support BP and assist with diuresis  RESP- bilateral effusions and bibasilar atelectasis- pulmonary toilet/ diuresis  RENAL- acute renal failure- long pump run and relative hypotension in setting of RAS- ATN, should resolve with time. Creatinine up, but has started to convert to nonoliguric phase overnight. Continue dopamine and lasix gtt. Will give diuril x 1 this AM  ANEMIA- secondary to ABL- better after transfusion  DECONDITIONING- PT consult    LOS: 7 days    [employer] C 07/03/2011

## 2011-07-04 ENCOUNTER — Inpatient Hospital Stay (HOSPITAL_COMMUNITY): Payer: Medicare HMO

## 2011-07-04 LAB — GLUCOSE, CAPILLARY
Glucose-Capillary: 112 mg/dL — ABNORMAL HIGH (ref 70–99)
Glucose-Capillary: 105 mg/dL — ABNORMAL HIGH (ref 70–99)

## 2011-07-04 LAB — CBC
MCV: 87.5 fL (ref 78.0–100.0)
Platelets: 125 10*3/uL — ABNORMAL LOW (ref 150–400)
RBC: 3.19 MIL/uL — ABNORMAL LOW (ref 4.22–5.81)
WBC: 12.2 10*3/uL — ABNORMAL HIGH (ref 4.0–10.5)

## 2011-07-04 LAB — BASIC METABOLIC PANEL
CO2: 27 mEq/L (ref 19–32)
Chloride: 91 mEq/L — ABNORMAL LOW (ref 96–112)
Creatinine, Ser: 2.27 mg/dL — ABNORMAL HIGH (ref 0.50–1.35)
Potassium: 3.6 mEq/L (ref 3.5–5.1)
Sodium: 129 mEq/L — ABNORMAL LOW (ref 135–145)

## 2011-07-04 LAB — POCT I-STAT, CHEM 8
BUN: 36 mg/dL — ABNORMAL HIGH (ref 6–23)
Chloride: 95 mEq/L — ABNORMAL LOW (ref 96–112)
Creatinine, Ser: 2.6 mg/dL — ABNORMAL HIGH (ref 0.50–1.35)
Potassium: 4 mEq/L (ref 3.5–5.1)
Sodium: 130 mEq/L — ABNORMAL LOW (ref 135–145)

## 2011-07-04 MED ORDER — FUROSEMIDE 10 MG/ML IJ SOLN
40.0000 mg | Freq: Four times a day (QID) | INTRAMUSCULAR | Status: AC
  Administered 2011-07-04 (×2): 40 mg via INTRAVENOUS
  Filled 2011-07-04 (×3): qty 4

## 2011-07-04 NOTE — Progress Notes (Signed)
TCTS BRIEF SICU PROGRESS NOTE  4 Days Post-Op  S/P Procedure(s) (LRB): CORONARY ARTERY BYPASS GRAFTING (CABG) (N/A)   Stable day  Plan: Continue current plan  Peytyn Trine H 07/04/2011 5:29 PM

## 2011-07-04 NOTE — Progress Notes (Signed)
   CARDIOTHORACIC SURGERY PROGRESS NOTE   R4 Days Post-Op Procedure(s) (LRB): CORONARY ARTERY BYPASS GRAFTING (CABG) (N/A)  Subjective: Feels okay.  No SOB.  Mild soreness. Not much of an appetite  Objective: Vital signs: BP Readings from Last 1 Encounters:  07/04/11 107/58   Pulse Readings from Last 1 Encounters:  07/04/11 72   Resp Readings from Last 1 Encounters:  07/04/11 11   Temp Readings from Last 1 Encounters:  07/04/11 98.2 F (36.8 C) Oral    Hemodynamics:    Physical Exam:  Rhythm:   sinus  Breath sounds: clear  Heart sounds:  RRR  Incisions:  Clean and dry  Abdomen:  Soft, non tender  Extremities:  Warm, well perfused   Intake/Output from previous day: 06/14 0701 - 06/15 0700 In: 1313 [P.O.:410; I.V.:853; IV Piggyback:50] Out: 3965 [Urine:3965] Intake/Output this shift: Total I/O In: -  Out: 580 [Urine:580]  Lab Results:  [facility] 07/04/11 0330 07/03/11 1732 07/03/11 0423  WBC 12.2* -- 17.7*  HGB 9.6* 9.9* --  HCT 27.9* 29.0* --  PLT 125* -- 114*   BMET:  Basename 07/04/11 0330 07/03/11 1732 07/03/11 0423  NA 129* 130* --  K 3.6 4.0 --  CL 91* 95* --  CO2 27 -- 21  GLUCOSE 89 109* --  BUN 39* 36* --  CREATININE 2.27* 2.60* --  CALCIUM 8.2* -- 8.2*    CBG (last 3)   Basename 07/04/11 0722 07/03/11 1554 07/03/11 1157  GLUCAP 105* 106* 109*   ABG    Component Value Date/Time   PHART 7.426 07/01/2011 0948   HCO3 24.6* 07/01/2011 0948   TCO2 25 07/03/2011 1732   ACIDBASEDEF 1.0 06/30/2011 2155   O2SAT 98.0 07/01/2011 0948   CXR:  *RADIOLOGY REPORT*  Clinical Data: Follow up pleural effusions  PORTABLE CHEST - 1 VIEW  Comparison: 07/03/2011  Findings: There is a right IJ catheter. The tip is in the SVC.  Prior median sternotomy and CABG procedure.  There are persistent bilateral pleural effusions and bibasilar  atelectasis. This is unchanged from previous exam. Upper lobes  are clear.  IMPRESSION:  1. No change in bilateral  pleural effusions and bibasilar  atelectasis.  Original Report Authenticated By: Rosealee Albee, M.D.        Assessment/Plan: S/P Procedure(s) (LRB): CORONARY ARTERY BYPASS GRAFTING (CABG) (N/A)  Overall stable POD4 Acute renal failure, non-oliguric, resolving Resp status stable Expected post op acute blood loss anemia, mild, stable Hyponatremia, likely related to free H20 excess, stable Ischemic cardiomyopathy PVD   Will d/c lasix drip and convert to intermittent dosing  Wean dopamine as tolerated  Mobilize   Jermine Bibbee H 07/04/2011 9:46 AM

## 2011-07-05 ENCOUNTER — Inpatient Hospital Stay (HOSPITAL_COMMUNITY): Payer: Medicare HMO

## 2011-07-05 LAB — GLUCOSE, CAPILLARY: Glucose-Capillary: 81 mg/dL (ref 70–99)

## 2011-07-05 LAB — CBC
HCT: 26.4 % — ABNORMAL LOW (ref 39.0–52.0)
Hemoglobin: 9.2 g/dL — ABNORMAL LOW (ref 13.0–17.0)
RBC: 3.03 MIL/uL — ABNORMAL LOW (ref 4.22–5.81)

## 2011-07-05 LAB — BASIC METABOLIC PANEL
CO2: 27 mEq/L (ref 19–32)
Glucose, Bld: 81 mg/dL (ref 70–99)
Potassium: 3.3 mEq/L — ABNORMAL LOW (ref 3.5–5.1)
Sodium: 130 mEq/L — ABNORMAL LOW (ref 135–145)

## 2011-07-05 MED ORDER — POTASSIUM CHLORIDE 10 MEQ/50ML IV SOLN
10.0000 meq | INTRAVENOUS | Status: AC
  Administered 2011-07-05 (×2): 10 meq via INTRAVENOUS

## 2011-07-05 MED ORDER — POTASSIUM CHLORIDE 10 MEQ/50ML IV SOLN
10.0000 meq | INTRAVENOUS | Status: DC | PRN
  Administered 2011-07-05: 10 meq via INTRAVENOUS
  Filled 2011-07-05: qty 50
  Filled 2011-07-05: qty 200

## 2011-07-05 MED ORDER — ENOXAPARIN SODIUM 30 MG/0.3ML ~~LOC~~ SOLN
30.0000 mg | SUBCUTANEOUS | Status: DC
  Administered 2011-07-05 – 2011-07-08 (×4): 30 mg via SUBCUTANEOUS
  Filled 2011-07-05 (×6): qty 0.3

## 2011-07-05 MED ORDER — FUROSEMIDE 10 MG/ML IJ SOLN
40.0000 mg | Freq: Two times a day (BID) | INTRAMUSCULAR | Status: AC
  Administered 2011-07-05 (×2): 40 mg via INTRAVENOUS
  Filled 2011-07-05 (×2): qty 4

## 2011-07-05 NOTE — Progress Notes (Addendum)
   CARDIOTHORACIC SURGERY PROGRESS NOTE   R5 Days Post-Op Procedure(s) (LRB): CORONARY ARTERY BYPASS GRAFTING (CABG) (N/A)  Subjective: Feels weak and tired.  Minimal pain. Marginal appetite. No SOB.  Objective: Vital signs: BP Readings from Last 1 Encounters:  07/05/11 99/49   Pulse Readings from Last 1 Encounters:  07/05/11 79   Resp Readings from Last 1 Encounters:  07/05/11 11   Temp Readings from Last 1 Encounters:  07/05/11 98 F (36.7 C) Oral    Hemodynamics:    Physical Exam:  Rhythm:   sinus  Breath sounds: clear  Heart sounds:  RRR  Incisions:  Clean and dry  Abdomen:  soft  Extremities:  Warm, mild LE edema   Intake/Output from previous day: 06/15 0701 - 06/16 0700 In: 634.1 [I.V.:576.1; IV Piggyback:58] Out: 2955 [Urine:2955] Intake/Output this shift: Total I/O In: 24.2 [I.V.:24.2] Out: 75 [Urine:75]  Lab Results:  [facility] 07/05/11 0330 07/04/11 0330  WBC 7.6 12.2*  HGB 9.2* 9.6*  HCT 26.4* 27.9*  PLT 143* 125*   BMET:  Basename 07/05/11 0330 07/04/11 0330  NA 130* 129*  K 3.3* 3.6  CL 92* 91*  CO2 27 27  GLUCOSE 81 89  BUN 41* 39*  CREATININE 1.80* 2.27*  CALCIUM 8.1* 8.2*    CBG (last 3)   Basename 07/05/11 0759 07/04/11 2114 07/04/11 1553  GLUCAP 81 89 105*   ABG    Component Value Date/Time   PHART 7.426 07/01/2011 0948   HCO3 24.6* 07/01/2011 0948   TCO2 25 07/03/2011 1732   ACIDBASEDEF 1.0 06/30/2011 2155   O2SAT 98.0 07/01/2011 0948   CXR: Tiny L effusion, otherwise clear  Assessment/Plan: S/P Procedure(s) (LRB): CORONARY ARTERY BYPASS GRAFTING (CABG) (N/A)  Overall stable Still on dopamine at 2 mcg/kg/min Slow progress but improving Acute renal failure resolving Expected post op acute blood loss anemia, mild, stable Expected post op volume excess, mild, diuresing well, weight approaching baseline   Continue to wean dopamine off  Mobilize  PT, OT  D/C CBG's  Wean dopamine   Jakylah Bassinger  H 07/05/2011 9:34 AM

## 2011-07-05 NOTE — Progress Notes (Signed)
TCTS BRIEF SICU PROGRESS NOTE  5 Days Post-Op  S/P Procedure(s) (LRB): CORONARY ARTERY BYPASS GRAFTING (CABG) (N/A)   Stable day but still on low dose dopamine  Plan: Continue current plan  Waynesha Rammel H 07/05/2011 7:48 PM

## 2011-07-06 LAB — BASIC METABOLIC PANEL
CO2: 27 mEq/L (ref 19–32)
Calcium: 7.9 mg/dL — ABNORMAL LOW (ref 8.4–10.5)
Chloride: 92 mEq/L — ABNORMAL LOW (ref 96–112)
Potassium: 3.6 mEq/L (ref 3.5–5.1)
Sodium: 129 mEq/L — ABNORMAL LOW (ref 135–145)

## 2011-07-06 LAB — CBC
HCT: 28.3 % — ABNORMAL LOW (ref 39.0–52.0)
Hemoglobin: 9.7 g/dL — ABNORMAL LOW (ref 13.0–17.0)
MCV: 87.6 fL (ref 78.0–100.0)
Platelets: 192 10*3/uL (ref 150–400)
RBC: 3.23 MIL/uL — ABNORMAL LOW (ref 4.22–5.81)
WBC: 9.8 10*3/uL (ref 4.0–10.5)

## 2011-07-06 MED ORDER — ENSURE COMPLETE PO LIQD
237.0000 mL | Freq: Two times a day (BID) | ORAL | Status: DC
  Administered 2011-07-07 – 2011-07-09 (×3): 237 mL via ORAL

## 2011-07-06 MED ORDER — POTASSIUM CHLORIDE 10 MEQ/50ML IV SOLN
10.0000 meq | INTRAVENOUS | Status: AC
  Administered 2011-07-06 (×3): 10 meq via INTRAVENOUS
  Filled 2011-07-06: qty 150

## 2011-07-06 NOTE — Progress Notes (Signed)
Patient ID: [name] [name], male   DOB: 1945-12-31, 66 y.o.   MRN: [id number]   Filed Vitals:   07/06/11 1800 07/06/11 1900 07/06/11 1936 07/06/11 2000  BP: 140/49 113/48  124/64  Pulse: 73 67  73  Temp:   98.5 F (36.9 C)   TempSrc:   Oral   Resp: 18 16  17   Height:      Weight:      SpO2: 95% 97%  96%   Urine output good  Ambulated well  Stable day.

## 2011-07-06 NOTE — Evaluation (Signed)
Occupational Therapy Evaluation Patient Details Name: [name] [name] MRN: [id number] DOB: March 24, 1945 Today's Date: 07/06/2011 Time: 9518-8416 OT Time Calculation (min): 27 min  OT Assessment / Plan / Recommendation Clinical Impression  Pt 66 yo male s/p CABG x 5. Skilled OT recommended to maximize I w/BADLs to mod I level in prep for d/c home with HHOT.    OT Assessment  Patient needs continued OT Services    Follow Up Recommendations  Home health OT    Barriers to Discharge      Equipment Recommendations  Rolling walker with 5" wheels    Recommendations for Other Services    Frequency  Min 2X/week    Precautions / Restrictions Precautions Precautions: Sternal Restrictions Weight Bearing Restrictions: No   Pertinent Vitals/Pain     ADL  Grooming: Simulated;Set up Where Assessed - Grooming: Unsupported sitting Upper Body Bathing: Simulated;Set up Where Assessed - Upper Body Bathing: Unsupported sitting Lower Body Bathing: Simulated;Minimal assistance Where Assessed - Lower Body Bathing: Supported sit to stand Upper Body Dressing: Simulated;Set up Where Assessed - Upper Body Dressing: Supported sit to stand Lower Body Dressing: Simulated;Minimal assistance Where Assessed - Lower Body Dressing: Sopported sit to stand Toilet Transfer: Simulated;Minimal assistance Toilet Transfer Method: Sit to stand Toileting - Clothing Manipulation and Hygiene: Simulated;Minimal assistance Where Assessed - Engineer, mining and Hygiene: Standing Equipment Used: Rolling walker;Gait belt Transfers/Ambulation Related to ADLs: Pt ambulated 100 feet with several standing rest breaks. Fatigues quickly.    OT Diagnosis: Generalized weakness  OT Problem List: Decreased activity tolerance;Decreased safety awareness;Decreased knowledge of use of DME or AE;Decreased knowledge of precautions OT Treatment Interventions: Self-care/ADL training;Therapeutic activities;DME and/or AE  instruction;Patient/family education   OT Goals Acute Rehab OT Goals OT Goal Formulation: With patient Time For Goal Achievement: 07/20/11 Potential to Achieve Goals: Good ADL Goals Pt Will Perform Grooming: with modified independence;Standing at sink ADL Goal: Grooming - Progress: Goal set today Pt Will Perform Lower Body Bathing: with modified independence;Sit to stand from chair;Sit to stand from bed ADL Goal: Lower Body Bathing - Progress: Goal set today Pt Will Perform Lower Body Dressing: with modified independence;Sit to stand from bed;Sit to stand from chair ADL Goal: Lower Body Dressing - Progress: Goal set today Pt Will Transfer to Toilet: with modified independence;Ambulation;Regular height toilet ADL Goal: Toilet Transfer - Progress: Goal set today Pt Will Perform Toileting - Clothing Manipulation: with modified independence;Standing ADL Goal: Toileting - Clothing Manipulation - Progress: Goal set today Pt Will Perform Toileting - Hygiene: with modified independence;Sit to stand from 3-in-1/toilet ADL Goal: Toileting - Hygiene - Progress: Goal set today Pt Will Perform Tub/Shower Transfer: Tub transfer;with supervision;Ambulation ADL Goal: Tub/Shower Transfer - Progress: Goal set today Miscellaneous OT Goals Miscellaneous OT Goal #1: Pt will verbalize all sternal precautions and incorporate them into ADL activity and mobility with 100% accuracy. OT Goal: Miscellaneous Goal #1 - Progress: Goal set today  Visit Information  Last OT Received On: 07/06/11 Assistance Needed: +1 PT/OT Co-Evaluation/Treatment: Yes    Subjective Data      Prior Functioning  Home Living Lives With: Alone Available Help at Discharge: Family;Friend(s) Type of Home: House Home Access: Stairs to enter [employer] of Steps: 4 Home Layout: One level Bathroom Shower/Tub: Engineer, manufacturing systems: Standard Bathroom Accessibility: Yes How Accessible: Accessible via  walker Home Adaptive Equipment: Straight cane Prior Function Level of Independence: Independent Able to Take Stairs?: Yes Driving: Yes Vocation: Retired Musician: No difficulties Dominant Hand: Right  Cognition  Overall Cognitive Status: Appears within functional limits for tasks assessed/performed Arousal/Alertness: Awake/alert Orientation Level: Appears intact for tasks assessed Behavior During Session: [facility] for tasks performed Memory: Decreased recall of precautions    Extremity/Trunk Assessment Right Upper Extremity Assessment RUE ROM/Strength/Tone: [facility] for tasks assessed Left Upper Extremity Assessment LUE ROM/Strength/Tone: WFL for tasks assessed   Mobility Bed Mobility Bed Mobility: Sit to Supine Sit to Supine: HOB flat;5: Supervision Details for Bed Mobility Assistance: cueing for sequence and positioning Transfers Sit to Stand: 4: Min guard Stand to Sit: 5: Supervision Details for Transfer Assistance: cueing for hand placement on thighs and sequence   Exercise    Balance Static Sitting Balance Static Sitting - Balance Support: No upper extremity supported;Feet supported Static Sitting - Level of Assistance: 5: Stand by assistance  End of Session OT - End of Session Equipment Utilized During Treatment: Gait belt Activity Tolerance: Patient tolerated treatment well Patient left: in bed;with call bell/phone within reach   Noah Pelaez A OTR/L [phone] 07/06/2011, 8:47 AM

## 2011-07-06 NOTE — Progress Notes (Signed)
Physical Therapy Treatment Patient Details Name: [name] [name] MRN: [id number] DOB: 08/29/1945 Today's Date: 07/06/2011 Time: 4098-1191 PT Time Calculation (min): 27 min  PT Assessment / Plan / Recommendation Comments on Treatment Session  Pt in chair on arrival and returned to bed despite encouragement to be OOB for breakfast. Pt educated for all sternal precautions with handout provided. Pt states he thinks family can arrange 24 hr care for discharge home. Pt pleasant and wants to return to motorcycle riding. Pt encouraged to ambulate again today. Will follow.    Follow Up Recommendations  Home health PT    Barriers to Discharge        Equipment Recommendations  Rolling walker with 5" wheels    Recommendations for Other Services    Frequency Min 3X/week   Plan Discharge plan needs to be updated    Precautions / Restrictions Precautions Precautions: Sternal Restrictions Weight Bearing Restrictions: No   Pertinent Vitals/Pain No pain sats 96% on RA HR 69-75 during tx BP 107/53 (60) before unable to get reading after    Mobility  Bed Mobility Bed Mobility: Sit to Supine Sit to Supine: HOB flat;5: Supervision Details for Bed Mobility Assistance: cueing for sequence and positioning Transfers Transfers: Sit to Stand;Stand to Sit Sit to Stand: 4: Min guard Stand to Sit: 5: Supervision Details for Transfer Assistance: cueing for hand placement on thighs and sequence Ambulation/Gait Ambulation/Gait Assistance: 5: Supervision Ambulation Distance (Feet): 110 Feet Assistive device: Rolling walker Ambulation/Gait Assistance Details: assist for line management, decreased stride and speed with grossly 4 standing rest breaks during ambulation Gait Pattern: Decreased stride length Stairs: No    Exercises     PT Diagnosis:    PT Problem List:   PT Treatment Interventions:     PT Goals Acute Rehab PT Goals Pt will go Sit to Stand: with modified independence PT Goal: Sit to  Stand - Progress: Updated due to goal met PT Goal: Ambulate - Progress: Progressing toward goal Additional Goals Additional Goal #1: Pt will independently state and demonstrate adherence to all sternal precautions PT Goal: Additional Goal #1 - Progress: Goal set today  Visit Information  Last PT Received On: 07/06/11 Assistance Needed: +1 PT/OT Co-Evaluation/Treatment: Yes    Subjective Data  Subjective: I just want to be able to walk and breathe   Cognition  Overall Cognitive Status: Appears within functional limits for tasks assessed/performed Arousal/Alertness: Awake/alert Orientation Level: Appears intact for tasks assessed Behavior During Session: [facility] for tasks performed Memory: Decreased recall of precautions    Balance  Static Sitting Balance Static Sitting - Balance Support: No upper extremity supported;Feet supported Static Sitting - Level of Assistance: 5: Stand by assistance  End of Session PT - End of Session Equipment Utilized During Treatment: Gait belt Activity Tolerance: Patient tolerated treatment well Patient left: in bed;with call bell/phone within reach Nurse Communication: Mobility status    Delorse Lek 07/06/2011, 8:41 AM Delaney Meigs, PT [phone]

## 2011-07-06 NOTE — Progress Notes (Signed)
6 Days Post-Op Procedure(s) (LRB): CORONARY ARTERY BYPASS GRAFTING (CABG) (N/A) Subjective: Tired after walk Walked 18' then 100' this AM Pain mild  Objective: Vital signs in last 24 hours: Temp:  [97.6 F (36.4 C)-98.6 F (37 C)] 97.8 F (36.6 C) (06/17 0741) Pulse Rate:  [54-73] 66  (06/17 0800) Cardiac Rhythm:  [-] Normal sinus rhythm (06/17 0800) Resp:  [11-19] 15  (06/17 0800) BP: (84-141)/(39-78) 122/48 mmHg (06/17 0800) SpO2:  [92 %-100 %] 97 % (06/17 0800)  Hemodynamic parameters for last 24 hours:    Intake/Output from previous day: 06/16 0701 - 06/17 0700 In: 869.8 [P.O.:120; I.V.:545.8; IV Piggyback:204] Out: 1565 [Urine:1565] Intake/Output this shift: Total I/O In: 52.8 [I.V.:2.8; IV Piggyback:50] Out: -   General appearance: alert and no distress Neurologic: intact Heart: regular rate and rhythm Lungs: diminished breath sounds base - left Extremities: edema 2+ Wound: clean, dry and intact  Lab Results:  Basename 07/06/11 0411 07/05/11 0330  WBC 9.8 7.6  HGB 9.7* 9.2*  HCT 28.3* 26.4*  PLT 192 143*   BMET:  Basename 07/06/11 0411 07/05/11 0330  NA 129* 130*  K 3.6 3.3*  CL 92* 92*  CO2 27 27  GLUCOSE 78 81  BUN 41* 41*  CREATININE 1.59* 1.80*  CALCIUM 7.9* 8.1*    PT/INR: No results found for this basename: LABPROT,INR in the last 72 hours ABG    Component Value Date/Time   PHART 7.426 07/01/2011 0948   HCO3 24.6* 07/01/2011 0948   TCO2 25 07/03/2011 1732   ACIDBASEDEF 1.0 06/30/2011 2155   O2SAT 98.0 07/01/2011 0948   CBG (last 3)   Basename 07/05/11 0759 07/04/11 2114 07/04/11 1553  GLUCAP 81 89 105*    Assessment/Plan: S/P Procedure(s) (LRB): CORONARY ARTERY BYPASS GRAFTING (CABG) (N/A) - POD # 6- continues to make slow progress CV- ischemic cardiomyopathy- down to 0.5 mcg/kg/min on dopamine, will d/c dopamine  Low dose coreg  Will not start ACE-I this admission due to BP issues, likely RAS and recent ARF  RESP- small  bilateral effusions L > R, left basilar atelectasis  RENAL- ARF secondary to ATN resolving. True preop weight hard to determine, due to discrepancy between weight on 6/7 and 6/8, but I suspect he's within a couple of pounds- hold lasix today  Thrombocytopenia resolved- on low dose lovenox and SCD for DVT prophylaxis  DECONDITIONING- continue PT  May be ready for transfer to step down tomorrow   LOS: 10 days    [name] [name] C 07/06/2011

## 2011-07-06 NOTE — Progress Notes (Signed)
07/03/11 1121  PT Visit Information  Last PT Received On 07/03/11  Assistance Needed +2  PT Time Calculation  PT Start Time 1030  PT Stop Time 1102  PT Time Calculation (min) 32 min  Subjective Data  Subjective There's something coming out of the floor that looks like cotton candy  Patient Stated Goal Home I  Precautions  Precautions Sternal;Fall  Home Living  Lives With Alone  Available Help at Discharge Family;Friend(s)  Type of Home House  Home Access Stairs to enter  Entrance Stairs-Number of Steps 4  Home Layout One level  Bathroom Nurse, children's Yes  Home Adaptive Equipment Straight cane;None (pt's friend has some equipment to borrow)  Prior Function  Level of Independence Independent  Able to Take Stairs? Yes  Driving Yes  Communication  Communication No difficulties  Cognition  Overall Cognitive Status Impaired  Area of Impairment Memory;Attention  Arousal/Alertness Awake/alert  Orientation Level Appears intact for tasks assessed;Disoriented to;Place;Time  Behavior During Session [facility] for tasks performed  Current Attention Level Selective  Memory Decreased recall of precautions  Right Upper Extremity Assessment  RUE ROM/Strength/Tone WFL  Left Upper Extremity Assessment  LUE ROM/Strength/Tone WFL  Right Lower Extremity Assessment  RLE ROM/Strength/Tone WFL  RLE Coordination WFL - gross motor  Left Lower Extremity Assessment  LLE ROM/Strength/Tone WFL (bil general weakness at 4/5, with coordination issues)  LLE Coordination WFL - gross motor  Bed Mobility  Bed Mobility Not assessed  Transfers  Transfers Sit to Stand;Stand to Sit  Sit to Stand 1: +2 Total assist;Without upper extremity assist;From chair/3-in-1  Sit to Stand: Patient Percentage 50%  Ambulation/Gait  Ambulation/Gait Assistance 3: Mod assist  Ambulation Distance (Feet) 48 Feet  Assistive device Other (Comment) (W/C to push)    Ambulation/Gait Assistance Details retropulsion; short 2" steps, wide BOS; tc's physical cues to propel and rmaneuver RW  Gait Pattern Step-through pattern;Decreased step length - right;Decreased step length - left;Decreased stride length;Wide base of support  Stairs No  Engineering geologist No  Balance  Balance Assessed Yes  Static Sitting Balance  Static Sitting - Balance Support Left upper extremity supported;Right upper extremity supported;Feet supported  Static Sitting - Level of Assistance 5: Stand by assistance  PT - End of Session  Activity Tolerance Patient limited by fatigue;Patient tolerated treatment well  Patient left in chair;in CPM;with family/visitor present  Nurse Communication Mobility status  PT Assessment  Clinical Impression Statement pt s/p Cabgx5; Presently weak with little activity tolerance, unsteady gait.  Will need rehab before D/C home  PT Recommendation/Assessment Patient needs continued PT services  PT Problem List Decreased strength;Decreased activity tolerance;Decreased balance;Decreased mobility;Decreased knowledge of use of DME;Cardiopulmonary status limiting activity;Pain  Barriers to Discharge Decreased caregiver support  PT Therapy Diagnosis  Difficulty walking;Generalized weakness  PT Plan  PT Frequency Min 3X/week  PT Treatment/Interventions DME instruction;Gait training;Functional mobility training;Therapeutic activities;Balance training;Patient/family education  PT Recommendation  Recommendations for Other Services Rehab consult  Follow Up Recommendations Inpatient Rehab  Equipment Recommended Defer to next venue  Individuals Consulted  Consulted and Agree with Results and Recommendations Family member/caregiver  Acute Rehab PT Goals  PT Goal Formulation With patient/family  Time For Goal Achievement 07/10/11  Potential to Achieve Goals Good  Pt will go Supine/Side to Sit with supervision  PT Goal: Supine/Side to Sit -  Progress Goal set today  Pt will go Sit to Stand with supervision  PT Goal: Sit to Stand -  Progress Goal set today  Pt will Transfer Bed to Chair/Chair to Bed with supervision  PT Transfer Goal: Bed to Chair/Chair to Bed - Progress Goal set today  Pt will Ambulate >150 feet;with supervision;with least restrictive assistive device  PT Goal: Ambulate - Progress Goal set today  PT General Charges  $$ ACUTE PT VISIT 1 Procedure  PT Evaluation  $Initial PT Evaluation Tier II 1 Procedure  PT Treatments  $Gait Training 8-22 mins   Information populated by [employer] created in note by Prudencio Pair, PT

## 2011-07-06 NOTE — Progress Notes (Signed)
Nutrition Follow-up  Patient extubated 6/12. RD visited patient in AM -- stated his appetite is poor. PO intake 10% 6/16, ate 100% at lunch today per flowsheet records. Amenable to vanilla Ensure supplements -- RD to order.  Diet Order:  Regular  Meds: Scheduled Meds:   . acetaminophen  1,000 mg Oral Q6H  . aspirin EC  325 mg Oral Daily  . atorvastatin  20 mg Oral QHS  . bisacodyl  10 mg Oral Daily   Or  . bisacodyl  10 mg Rectal Daily  . carvedilol  3.125 mg Oral BID WC  . docusate sodium  200 mg Oral Daily  . enoxaparin (LOVENOX) injection  30 mg Subcutaneous Q24H  . furosemide  40 mg Intravenous BID  . levothyroxine  50 mcg Oral QAC breakfast  . magnesium sulfate  4 g Intravenous Once  . pantoprazole  40 mg Oral Q1200  . potassium chloride  10 mEq Intravenous Q1 Hr x 4  . potassium chloride  10 mEq Intravenous Q1 Hr x 3  . sodium chloride  3 mL Intravenous Q12H  . DISCONTD: insulin aspart  0-15 Units Subcutaneous TID WC   Continuous Infusions:   . lactated ringers 20 mL/hr at 07/04/11 1900  . DISCONTD: sodium chloride 20 mL/hr at 07/01/11 2349  . DISCONTD: sodium chloride 20 mL/hr at 06/30/11 1600  . DISCONTD: sodium chloride    . DISCONTD: DOPamine 0.5 mcg/kg/min (07/06/11 0800)  . DISCONTD: insulin (NOVOLIN-R) infusion Stopped (06/30/11 1600)   PRN Meds:.ALPRAZolam, metoprolol, ondansetron (ZOFRAN) IV, oxyCODONE, sodium chloride  Labs:  CMP     Component Value Date/Time   NA 129* 07/06/2011 0411   K 3.6 07/06/2011 0411   CL 92* 07/06/2011 0411   CO2 27 07/06/2011 0411   GLUCOSE 78 07/06/2011 0411   BUN 41* 07/06/2011 0411   CREATININE 1.59* 07/06/2011 0411   CALCIUM 7.9* 07/06/2011 0411   PROT 5.3* 07/03/2011 0423   ALBUMIN 2.7* 07/03/2011 0423   AST 27 07/03/2011 0423   ALT 13 07/03/2011 0423   ALKPHOS 84 07/03/2011 0423   BILITOT 2.0* 07/03/2011 0423   GFRNONAA 44* 07/06/2011 0411   GFRAA 51* 07/06/2011 0411     Intake/Output Summary (Last 24 hours) at 07/06/11  1505 Last data filed at 07/06/11 1400  Gross per 24 hour  Intake 1027.76 ml  Output   1427 ml  Net -399.24 ml    CBG (last 3)   Basename 07/05/11 0759 07/04/11 2114 07/04/11 1553  GLUCAP 81 89 105*    Weight Status:  77.7 kg (6/16) -- stable  Re-estimated needs:  2100-2300 kcals, 105-115 gm protein  Nutrition Dx:  Inadequate Oral Intake now r/t poor appetite as evidenced by PO intake 10%, ongoing  New Goal:  Oral intake from meals & supplements to meet >90% of estimated nutrition needs, unmet Monitor: PO intake, weight, labs, I/O's  Intervention:    Add vanilla Ensure Complete PO BID (350 kcals, 13 gm protein per 8 fl oz bottle)  RD to follow for nutrition care plan  Alger Memos Pager #:  [phone]

## 2011-07-07 ENCOUNTER — Encounter: Payer: Self-pay | Admitting: Thoracic Surgery (Cardiothoracic Vascular Surgery)

## 2011-07-07 LAB — CBC
HCT: 28.6 % — ABNORMAL LOW (ref 39.0–52.0)
Hemoglobin: 10 g/dL — ABNORMAL LOW (ref 13.0–17.0)
MCHC: 35 g/dL (ref 30.0–36.0)
MCV: 88 fL (ref 78.0–100.0)

## 2011-07-07 LAB — BASIC METABOLIC PANEL
BUN: 38 mg/dL — ABNORMAL HIGH (ref 6–23)
GFR calc non Af Amer: 48 mL/min — ABNORMAL LOW (ref >90)
Glucose, Bld: 81 mg/dL (ref 70–99)
Potassium: 3.8 mEq/L (ref 3.5–5.1)

## 2011-07-07 MED ORDER — POTASSIUM CHLORIDE CRYS ER 20 MEQ PO TBCR
20.0000 meq | EXTENDED_RELEASE_TABLET | Freq: Every day | ORAL | Status: DC
  Administered 2011-07-07 – 2011-07-10 (×4): 20 meq via ORAL
  Filled 2011-07-07 (×4): qty 1

## 2011-07-07 MED ORDER — ACETAMINOPHEN 325 MG PO TABS: 650.0000 mg | ORAL_TABLET | Freq: Four times a day (QID) | ORAL | Status: DC | PRN

## 2011-07-07 MED ORDER — GUAIFENESIN-DM 100-10 MG/5ML PO SYRP: 15.0000 mL | ORAL_SOLUTION | ORAL | Status: DC | PRN

## 2011-07-07 MED ORDER — ALUM & MAG HYDROXIDE-SIMETH 200-200-20 MG/5ML PO SUSP: 15.0000 mL | ORAL | Status: DC | PRN

## 2011-07-07 MED ORDER — SODIUM CHLORIDE 0.9 % IJ SOLN: 3.0000 mL | INTRAMUSCULAR | Status: DC | PRN

## 2011-07-07 MED ORDER — MOVING RIGHT ALONG BOOK
Freq: Once | Status: AC
  Administered 2011-07-07: 14:00:00
  Filled 2011-07-07: qty 1

## 2011-07-07 MED ORDER — FUROSEMIDE 40 MG PO TABS
40.0000 mg | ORAL_TABLET | Freq: Every day | ORAL | Status: DC
  Administered 2011-07-07 – 2011-07-10 (×4): 40 mg via ORAL
  Filled 2011-07-07 (×4): qty 1

## 2011-07-07 MED ORDER — SODIUM CHLORIDE 0.9 % IV SOLN: 250.0000 mL | INTRAVENOUS | Status: DC | PRN

## 2011-07-07 MED ORDER — TRAMADOL HCL 50 MG PO TABS: 50.0000 mg | ORAL_TABLET | ORAL | Status: DC | PRN

## 2011-07-07 MED ORDER — MAGNESIUM HYDROXIDE 400 MG/5ML PO SUSP: 30.0000 mL | Freq: Every day | ORAL | Status: DC | PRN

## 2011-07-07 MED ORDER — SODIUM CHLORIDE 0.9 % IJ SOLN
3.0000 mL | Freq: Two times a day (BID) | INTRAMUSCULAR | Status: DC
  Administered 2011-07-07 – 2011-07-09 (×5): 3 mL via INTRAVENOUS

## 2011-07-07 NOTE — Progress Notes (Signed)
Pt ambulated approx 50 feet with rolling walker. Limited by fatigue. Pt walked again later this evening approx same distance with rolling walker. Encouraged to walk further but pt said he was too tired. Will try for further distance tomorrow.

## 2011-07-07 NOTE — Progress Notes (Signed)
Physical Therapy Treatment Patient Details Name: [name] [name] MRN: [id number] DOB: November 19, 1945 Today's Date: 07/07/2011 Time: 4098-1191 PT Time Calculation (min): 17 min  PT Assessment / Plan / Recommendation Comments on Treatment Session  Patient s/p CABG x 5 with decr mobility secondary to decr endurance for activity.  Will benefit from PT to address increasing endurance and independence with activity.      Follow Up Recommendations  Home health PT;Supervision - Intermittent    Barriers to Discharge  None      Equipment Recommendations  Rolling walker with 5" wheels    Recommendations for Other Services  None  Frequency Min 3X/week   Plan Discharge plan remains appropriate;Frequency remains appropriate    Precautions / Restrictions Precautions Precautions: Sternal;Fall Restrictions Weight Bearing Restrictions: No   Pertinent Vitals/Pain VSS, Some pain    Mobility  Bed Mobility Bed Mobility: Sit to Sidelying Left Sit to Supine: 4: Min assist;HOB flat Sit to Sidelying Left: 4: Min assist;HOB flat Details for Bed Mobility Assistance: cues for sequence and positioning Transfers Transfers: Sit to Stand;Stand to Sit Sit to Stand: 4: Min assist;Without upper extremity assist;From chair/3-in-1 Stand to Sit: 5: Supervision;Without upper extremity assist;To chair/3-in-1 Details for Transfer Assistance: cues for hand placement and sequencing to follow sternal precautions Ambulation/Gait Ambulation/Gait Assistance: 5: Supervision Ambulation Distance (Feet): 125 Feet Assistive device: Rolling walker Ambulation/Gait Assistance Details: assist for line management, decr stride and speed with grossly 2 standing rest breaks during ambulation. Gait Pattern: Decreased stride length Stairs: No Wheelchair Mobility Wheelchair Mobility: No    PT Goals Acute Rehab PT Goals PT Goal: Sit to Stand - Progress: Progressing toward goal PT Transfer Goal: Bed to Chair/Chair to Bed - Progress:  Progressing toward goal PT Goal: Ambulate - Progress: Progressing toward goal Additional Goals PT Goal: Additional Goal #1 - Progress: Progressing toward goal  Visit Information  Last PT Received On: 07/07/11 Assistance Needed: +1 PT/OT Co-Evaluation/Treatment: Yes    Subjective Data  Subjective: "I just want to try."   Cognition  Overall Cognitive Status: Appears within functional limits for tasks assessed/performed Area of Impairment: Attention;Memory Arousal/Alertness: Awake/alert Orientation Level: Appears intact for tasks assessed Behavior During Session: [name] [name] for tasks performed Current Attention Level: Selective Memory: Decreased recall of precautions    Balance  Static Sitting Balance Static Sitting - Balance Support: No upper extremity supported;Feet supported Static Sitting - Level of Assistance: 5: Stand by assistance Static Sitting - Comment/# of Minutes: up to 2 minutes  End of Session PT - End of Session Equipment Utilized During Treatment: Gait belt Activity Tolerance: Patient tolerated treatment well;Patient limited by fatigue Patient left: in bed;with call bell/phone within reach Nurse Communication: Mobility status    [name] [name],[name] [name] 07/07/2011, 11:56 AM  [name] [name] Acute Rehabilitation [phone] (pager)

## 2011-07-07 NOTE — Progress Notes (Signed)
Clinical Social Work-Covering CSW received word of transfer to new unit from CM- Notified unit based CSW who will be providing bed offers- SW will continue to follow- Jodean Lima, [phone]

## 2011-07-07 NOTE — Progress Notes (Addendum)
[address]            [place] [address]          [phone]     7 Days Post-Op Procedure(s) (LRB): CORONARY ARTERY BYPASS GRAFTING (CABG) (N/A)  Subjective: Feels better. Appetite poor, but +BM, denies CP or SOB.  Objective: Vital signs in last 24 hours: Patient Vitals for the past 24 hrs:  BP Temp Temp src Pulse Resp SpO2 Weight  07/07/11 0600 120/64 mmHg - - 70  16  95 % -  07/07/11 0500 115/72 mmHg - - 71  21  93 % 167 lb 5.3 oz (75.9 kg)  07/07/11 0400 129/58 mmHg 98.4 F (36.9 C) Oral 69  17  100 % -  07/07/11 0300 122/65 mmHg - - 69  17  100 % -  07/07/11 0200 118/57 mmHg - - 68  18  99 % -  07/07/11 0100 110/60 mmHg - - 72  17  98 % -  07/07/11 0000 115/61 mmHg 98.1 F (36.7 C) Oral 73  16  93 % -  07/06/11 2300 117/50 mmHg - - 72  14  94 % -  07/06/11 2200 109/56 mmHg - - 70  19  94 % -  07/06/11 2137 - - - 68  19  94 % -  07/06/11 2100 115/50 mmHg - - 68  18  94 % -  07/06/11 2000 124/64 mmHg - - 73  17  96 % -  07/06/11 1936 - 98.5 F (36.9 C) Oral - - - -  07/06/11 1900 113/48 mmHg - - 67  16  97 % -  07/06/11 1800 140/49 mmHg - - 73  18  95 % -  07/06/11 1700 154/48 mmHg - - 69  19  100 % -  07/06/11 1600 121/68 mmHg - - 71  18  95 % -  07/06/11 1559 - 97.6 F (36.4 C) Oral - - - -  07/06/11 1500 102/54 mmHg - - 72  16  96 % -  07/06/11 1400 132/52 mmHg - - 72  18  96 % -  07/06/11 1300 133/47 mmHg - - 71  22  96 % -  07/06/11 1200 125/60 mmHg - - 70  15  99 % -  07/06/11 1122 - 98.2 F (36.8 C) Oral - - - -  07/06/11 1100 104/80 mmHg - - 70  14  98 % -  07/06/11 1000 135/52 mmHg - - 72  23  99 % -  07/06/11 0900 108/58 mmHg - - 69  13  97 % -  07/06/11 0837 107/53 mmHg - - 75  - 96 % -  07/06/11 0815 107/53 mmHg - - 66  18  99 % -  07/06/11 0800 122/48 mmHg - - 66  15  97 % -   Current Weight  07/07/11 167 lb 5.3 oz (75.9 kg)   Pre-op wt= 74.8 kg  Intake/Output from previous day: 06/17 0701 - 06/18  0700 In: 1132.8 [P.O.:640; I.V.:442.8; IV Piggyback:50] Out: 850 [Urine:850]    PHYSICAL EXAM:  Heart: RRR Lungs: few crackles in bases, L>R Wound: clean and dry Extremities: +LE edema   Lab Results: CBC: Basename 07/07/11 0440 07/06/11 0411  WBC 10.1 9.8  HGB 10.0* 9.7*  HCT 28.6* 28.3*  PLT 222 192   BMET:  Basename 07/07/11  0440 07/06/11 0411  NA 135 129*  K 3.8 3.6  CL 95* 92*  CO2 27 27  GLUCOSE 81 78  BUN 38* 41*  CREATININE 1.48* 1.59*  CALCIUM 8.2* 7.9*    PT/INR: No results found for this basename: LABPROT,INR in the last 72 hours   Assessment/Plan: S/P Procedure(s) (LRB): CORONARY ARTERY BYPASS GRAFTING (CABG) (N/A)  CV- off Dopamine gtt. BP, HR stable.  Will titrate Coreg back to home dose as vitals tolerate.  No ACE-I for now since Cr still elevated.  ARI/ATN- Cr trending down. Continue to watch.  Lasix on hold.  Pulm- continue IS/pulm toilet. F/U effusions.  Continue PT/OT/reconditioning.  ?tx PTCU.   LOS: 11 days    COLLINS,GINA H 07/07/2011  Patient seen and examined. Agree with above. Will transfer to 2000 PO lasix

## 2011-07-07 NOTE — Progress Notes (Signed)
Occupational Therapy Treatment Patient Details Name: [name] [name] MRN: [id number] DOB: 11/29/1945 Today's Date: 07/07/2011 Time: 0900-0920 OT Time Calculation (min): 20 min  OT Assessment / Plan / Recommendation Comments on Treatment Session Pt. progressing well today and received further education on completing ADLs with following sternal precautions and energy conservation strategies    Follow Up Recommendations  Home health OT       Equipment Recommendations  Rolling walker with 5" wheels       Frequency Min 2X/week   Plan Discharge plan remains appropriate    Precautions / Restrictions Precautions Precautions: Sternal;Fall Precaution Comments: Pt. able to recall 2 precautions Restrictions Weight Bearing Restrictions: No   Pertinent Vitals/Pain No pain reports    ADL  Grooming: Performed;Wash/dry face;Teeth care;Set up;Min guard Where Assessed - Grooming: Supported standing Upper Body Dressing: Performed;Set up (don gown) Where Assessed - Upper Body Dressing: Unsupported sitting Transfers/Ambulation Related to ADLs: Pt. completed ~75' with RW and intermittent rest breaks due to SOB ADL Comments: Pt. educated on techniques for completing ADLs with following sternal precautions and wenergy conservation techniques due to pt. with SOB during standing ADLs ~23mins at sink      OT Goals Acute Rehab OT Goals OT Goal Formulation: With patient Time For Goal Achievement: 07/20/11 Potential to Achieve Goals: Good ADL Goals Pt Will Perform Grooming: with modified independence;Standing at sink ADL Goal: Grooming - Progress: Progressing toward goals Pt Will Transfer to Toilet: with modified independence;Ambulation;Regular height toilet ADL Goal: Toilet Transfer - Progress: Progressing toward goals Miscellaneous OT Goals Miscellaneous OT Goal #1: Pt will verbalize all sternal precautions and incorporate them into ADL activity and mobility with 100% accuracy. OT Goal:  Miscellaneous Goal #1 - Progress: Progressing toward goals  Visit Information  Last OT Received On: 07/07/11 Assistance Needed: +1 PT/OT Co-Evaluation/Treatment: Yes          Cognition  Overall Cognitive Status: Appears within functional limits for tasks assessed/performed Area of Impairment: Attention;Memory Arousal/Alertness: Awake/alert Orientation Level: Appears intact for tasks assessed Behavior During Session: [facility] for tasks performed Current Attention Level: Selective Memory: Decreased recall of precautions    Mobility Bed Mobility Bed Mobility: Sit to Sidelying Left Sit to Supine: 4: Min assist;HOB flat Sit to Sidelying Left: 4: Min assist;HOB flat Details for Bed Mobility Assistance: cues for sequence and positioning Transfers Transfers: Sit to Stand;Stand to Sit Sit to Stand: 4: Min assist;Without upper extremity assist;From chair/3-in-1 Stand to Sit: 5: Supervision;Without upper extremity assist;To chair/3-in-1 Details for Transfer Assistance: Cues for holding pillow       Balance Static Sitting Balance Static Sitting - Balance Support: No upper extremity supported;Feet supported Static Sitting - Level of Assistance: 5: Stand by assistance Static Sitting - Comment/# of Minutes: up to 2 minutes  End of Session OT - End of Session Equipment Utilized During Treatment: Gait belt Activity Tolerance: Patient tolerated treatment well Patient left: in bed;with call bell/phone within reach Nurse Communication: Mobility status   Aamani Moose, OTR/L Pager [phone] 07/07/2011, 11:58 AM

## 2011-07-08 ENCOUNTER — Inpatient Hospital Stay (HOSPITAL_COMMUNITY): Payer: Medicare HMO

## 2011-07-08 LAB — BASIC METABOLIC PANEL
BUN: 34 mg/dL — ABNORMAL HIGH (ref 6–23)
Calcium: 8.5 mg/dL (ref 8.4–10.5)
Chloride: 96 mEq/L (ref 96–112)
Creatinine, Ser: 1.27 mg/dL (ref 0.50–1.35)
GFR calc Af Amer: 67 mL/min — ABNORMAL LOW (ref >90)

## 2011-07-08 LAB — CBC
HCT: 34 % — ABNORMAL LOW (ref 39.0–52.0)
MCH: 30.8 pg (ref 26.0–34.0)
MCV: 88.8 fL (ref 78.0–100.0)
Platelets: 333 10*3/uL (ref 150–400)
RDW: 15.3 % (ref 11.5–15.5)
WBC: 14.2 10*3/uL — ABNORMAL HIGH (ref 4.0–10.5)

## 2011-07-08 NOTE — Progress Notes (Signed)
IV assessment done. IV flushes well.  Has been noted for 07/04/2011. Have made pt aware about policy to restart IV. Pt states will possibly be going home Thursday or Friday of this week and would rather keep current IV in. No complications noted. Will continue to monitor pt.

## 2011-07-08 NOTE — Progress Notes (Signed)
SNF bed offers given to patient this morning however he is stating he wants to look at going home at d/c- reminded him he will need 24 hour care at d/c and he states that this can be arranged via friends and family- asked him to make some calls to confirm this plan and I will f/u with him later today- RNCM advised of his interest in d/c to home with [facility]. Reece Levy, MSW, Theresia Majors [phone]

## 2011-07-08 NOTE — Progress Notes (Signed)
CARDIAC REHAB PHASE I   PRE:  Rate/Rhythm: 70 SR    BP: sitting 152/65    SaO2: 98 RA  MODE:  Ambulation: 300 ft   POST:  Rate/Rhythm: 83 SR with PACs    BP: sitting 155/40     SaO2: 99 RA  Pt steady with RW, assist x2. Very slow pace, presumably to conserve energy. Some SOB but SaO2 98 RA walking. Allowed to sit and rest and able to walk second lap. Return to chair. Very motivated. Can be x1. [id number]  [name] [name] CES, ACSM

## 2011-07-08 NOTE — Progress Notes (Addendum)
8 Days Post-Op Procedure(s) (LRB): CORONARY ARTERY BYPASS GRAFTING (CABG) (N/A)  Subjective: [name] denies chest pain and shortness of breath this morning.  He continues to have a poor appetite.  He is ambulating with assistance.  Patient appears Jaundiced   Objective: Vital signs in last 24 hours: Temp:  [97.6 F (36.4 C)-98.3 F (36.8 C)] 97.6 F (36.4 C) (06/19 0430) Pulse Rate:  [67-77] 69  (06/19 0430) Cardiac Rhythm:  [-] Normal sinus rhythm (06/18 1950) Resp:  [16-20] 18  (06/19 0430) BP: (108-160)/(51-92) 132/83 mmHg (06/19 0430) SpO2:  [93 %-99 %] 98 % (06/19 0430) Weight:  [166 lb 7.2 oz (75.5 kg)] 166 lb 7.2 oz (75.5 kg) (06/19 0430)   Intake/Output from previous day: 06/18 0701 - 06/19 0700 In: 80 [I.V.:80] Out: 150 [Urine:150]  General appearance: alert, cooperative and no distress Heart: regular rate and rhythm Lungs: clear to auscultation bilaterally Abdomen: soft, non-tender; bowel sounds normal; no masses,  no organomegaly Extremities: edema 1+ Wound: clean and dry  Lab Results:  [facility] 07/08/11 0625 07/07/11 0440  WBC 14.2* 10.1  HGB 11.8* 10.0*  HCT 34.0* 28.6*  PLT 333 222   BMET:  Basename 07/08/11 0625 07/07/11 0440  NA 134* 135  K 4.0 3.8  CL 96 95*  CO2 24 27  GLUCOSE 93 81  BUN 34* 38*  CREATININE 1.27 1.48*  CALCIUM 8.5 8.2*    PT/INR: No results found for this basename: LABPROT,INR in the last 72 hours ABG    Component Value Date/Time   PHART 7.426 07/01/2011 0948   HCO3 24.6* 07/01/2011 0948   TCO2 25 07/03/2011 1732   ACIDBASEDEF 1.0 06/30/2011 2155   O2SAT 98.0 07/01/2011 0948   CBG (last 3)   Basename 07/05/11 0759  GLUCAP 81    Assessment/Plan: S/P Procedure(s) (LRB): CORONARY ARTERY BYPASS GRAFTING (CABG) (N/A)  2. CV- NSR, rate in the 60s, pressure in the 130s, will continue Coreg, may benefit from addition of ACEI once renal function improves 3. ARI- creatinine trending down towards baseline, on PO Lasix, will  monitor 4. Resp- left sided pleural effusion/atelectasis, will continue Lasix and encourage IS 5. Patient appears Jaundiced- will check LFTs 6. Deconditioning- will cont PT/OT 7. Dispo- patient slowly improving, will continue current treatement, H/H to be arranged for discharge, which will aim for later this week   LOS: 12 days    [name], [name] 07/08/2011   Patient seen and examined. Agree with above.

## 2011-07-08 NOTE — Progress Notes (Signed)
Pt ambulated 150 ft in hallway.

## 2011-07-09 LAB — GLUCOSE, CAPILLARY

## 2011-07-09 MED ORDER — CARVEDILOL 3.125 MG PO TABS: 3.1250 mg | ORAL_TABLET | Freq: Two times a day (BID) | ORAL | Status: DC

## 2011-07-09 MED ORDER — ASPIRIN 325 MG PO TBEC: 325.0000 mg | DELAYED_RELEASE_TABLET | Freq: Every day | ORAL | Status: AC

## 2011-07-09 MED ORDER — OXYCODONE HCL 5 MG PO TABS: 5.0000 mg | ORAL_TABLET | ORAL | Status: AC | PRN

## 2011-07-09 MED ORDER — TRAMADOL HCL 50 MG PO TABS: 50.0000 mg | ORAL_TABLET | ORAL | Status: DC | PRN

## 2011-07-09 MED ORDER — ASPIRIN 325 MG PO TBEC: 325.0000 mg | DELAYED_RELEASE_TABLET | Freq: Every day | ORAL | Status: DC

## 2011-07-09 MED ORDER — OXYCODONE HCL 5 MG PO TABS: 5.0000 mg | ORAL_TABLET | ORAL | Status: DC | PRN

## 2011-07-09 NOTE — Discharge Instructions (Signed)
Discharge Instructions  1. Diet 2. No Driving for 4 weeks or while using narcotic pain medication 3. No heaving lifting (>8lbs) or strenuous activity 4. Wash wounds daily with soap and water, pat dry  Coronary Artery Bypass Grafting Care After Refer to this sheet in the next few weeks. These instructions provide you with information on caring for yourself after your procedure. Your caregiver may also give you more specific instructions. Your treatment has been planned according to current medical practices, but problems sometimes occur. Call your caregiver if you have any problems or questions after your procedure.  Recovery from open heart surgery will be different for everyone. Some people feel well after 3 or 4 weeks, while for others it takes longer. After heart surgery, it may be normal to:  Not have an appetite, feel nauseated by the smell of food, or only want to eat a small amount.   Be constipated because of changes in your diet, activity, and medicines. Eat foods high in fiber. Add fresh fruits and vegetables to your diet. Stool softeners may be helpful.   Feel sad or unhappy. You may be frustrated or cranky. You may have good days and bad days. Do not give up. Talk to your caregiver if you do not feel better.   Feel weakness and fatigue. You many need physical therapy or cardiac rehabilitation to get your strength back.   Develop an irregular heartbeat called atrial fibrillation. Symptoms of atrial fibrillation are a fast, irregular heartbeat or feelings of fluttery heartbeats, shortness of breath, low blood pressure, and dizziness. If these symptoms develop, see your caregiver right away.  MEDICATION  Have a list of all the medicines you will be taking when you leave the hospital. For every medicine, know the following:   Name.   Exact dose.   Time of day to be taken.   How often it should be taken.   Why you are taking it.   Ask which medicines should or should not be  taken together. If you take more than one heart medicine, ask if it is okay to take them together. Some heart medicines should not be taken at the same time because they may lower your blood pressure too much.   Narcotic pain medicine can cause constipation. Eat fresh fruits and vegetables. Add fiber to your diet. Stool softener medicine may help relieve constipation.   Keep a copy of your medicines with you at all times.   Do not add or stop taking any medicine until you check with your caregiver.   Medicines can have side effects. Call your caregiver who prescribed the medicine if you:   Start throwing up, have diarrhea, or have stomach pain.   Feel dizzy or lightheaded when you stand up.   Feel your heart is skipping beats or is beating too fast or too slow.   Develop a rash.   Notice unusual bruising or bleeding.  HOME CARE INSTRUCTIONS  After heart surgery, it is important to learn how to take your pulse. Have your caregiver show you how to take your pulse.   Use your incentive spirometer. Ask your caregiver how long after surgery you need to use it.  Care of your chest incision  Tell your caregiver right away if you notice clicking in your chest (sternum).   Support your chest with a pillow or your arms when you take deep breaths and cough.   Follow your caregiver's instructions about when you can bathe or swim.  Protect your incision from sunlight during the first year to keep the scar from getting dark.   Tell your caregiver if you notice:   Increased tenderness of your incision.   Increased redness or swelling around your incision.   Drainage or pus from your incision.  Care of your leg incision(s)  Avoid crossing your legs.   Avoid sitting for long periods of time. Change positions every half hour.   Elevate your leg(s) when you are sitting.   Check your leg(s) daily for swelling. Check the incisions for redness or drainage.   Wear your elastic stockings  as told by your caregiver. Take them off at bedtime.  Diet  Diet is very important to heart health.   Eat plenty of fresh fruits and vegetables. Meats should be lean cut. Avoid canned, processed, and fried foods.   Talk to a dietician. They can teach you how to make healthy food and drink choices.  Weight  Weigh yourself every day. This is important because it helps to know if you are retaining fluid that may make your heart and lungs work harder.   Use the same scale each time.   Weigh yourself every morning at the same time. You should do this after you go to the bathroom, but before you eat breakfast.   Your weight will be more accurate if you do not wear any clothes.   Record your weight.   Tell your caregiver if you have gained 2 pounds or more overnight.  Activity Stop any activity at once if you have chest pain, shortness of breath, irregular heartbeats, or dizziness. Get help right away if you have any of these symptoms.  Bathing.  Avoid soaking in a bath or hot tub until your incisions are healed.   Rest. You need a balance of rest and activity.   Exercise. Exercise per your caregiver's advice. You may need physical therapy or cardiac rehabilitation to help strengthen your muscles and build your endurance.   Climbing stairs. Unless your caregiver tells you not to climb stairs, go up stairs slowly and rest if you tire. Do not pull yourself up by the handrail.   Driving a car. Follow your caregiver's advice on when you may drive. You may ride as a passenger at any time. When traveling for long periods of time in a car, get out of the car and walk around for a few minutes every 2 hours.   Lifting. Avoid lifting, pushing, or pulling anything heavier than 10 pounds for 6 weeks after surgery or as told by your caregiver.   Returning to work. Check with your caregiver. People heal at different rates. Most people will be able to go back to work 6 to 12 weeks after surgery.    Sexual activity. You may resume sexual relations as told by your caregiver.  SEEK MEDICAL CARE IF:  Any of your incisions are red, painful, or have any type of drainage coming from them.   You have an oral temperature above 102 F (38.9 C).   You have ankle or leg swelling.   You have pain in your legs.   You have weight gain of 2 or more pounds a day.   You feel dizzy or lightheaded when you stand up.  SEEK IMMEDIATE MEDICAL CARE IF:  You have angina or chest pain that goes to your jaw or arms. Call your local emergency services right away.   You have shortness of breath at rest or with activity.  You have a fast or irregular heartbeat (arrhythmia).   There is a "clicking" in your sternum when you move.   You have numbness or weakness in your arms or legs.  MAKE SURE YOU:  Understand these instructions.   Will watch your condition.   Will get help right away if you are not doing well or get worse.  Document Released: 07/25/2004 Document Revised: 12/25/2010 Document Reviewed: 03/12/2010 [facility] Patient Information 2012 [place], [place].  Endoscopic Saphenous Vein Harvesting Care After Refer to this sheet in the next few weeks. These instructions provide you with information on caring for yourself after your procedure. Your caregiver may also give you more specific instructions. Your treatment has been planned according to current medical practices, but problems sometimes occur. Call your caregiver if you have any problems or questions after your procedure. HOME CARE INSTRUCTIONS Medicine  Take whatever pain medicine your surgeon prescribes. Follow the directions carefully. Do not take over-the-counter pain medicine unless your surgeon says it is okay. Some pain medicine can cause bleeding problems for several weeks after surgery.   Follow your surgeon's instructions about driving. You will probably not be permitted to drive after heart surgery.   Take any medicines  your surgeon prescribes. Any medicines you took before your heart surgery should be checked with your caregiver before you start taking them again.  Wound care  Ask your surgeon how long you should keep wearing your elastic bandage or stocking.   Check the area around your surgical cuts (incisions) whenever your bandages (dressings) are changed. Look for any redness or swelling.   You will need to return to have the stitches (sutures) or staples taken out. Ask your surgeon when to do that.   Ask your surgeon when you can shower or bathe.  Activity  Try to keep your legs raised when you are sitting.   Do any exercises your caregivers have given you. These may include deep breathing exercises, coughing, walking, or other exercises.  SEEK MEDICAL CARE IF:  You have any questions about your medicines.   You have more leg pain, especially if your pain medicine stops working.   New or growing bruises develop on your leg.   Your leg swells, feels tight, or becomes red.   You have numbness in your leg.  SEEK IMMEDIATE MEDICAL CARE IF:  Your pain gets much worse.   Blood or fluid leaks from any of the incisions.   Your incisions become warm, swollen, or red.   You have chest pain.   You have trouble breathing.   You have a fever.   You have more pain near your leg incision.  MAKE SURE YOU:  Understand these instructions.   Will watch your condition.   Will get help right away if you are not doing well or get worse.  Document Released: 09/17/2010 Document Revised: 12/25/2010 Document Reviewed: 09/17/2010 [facility] Patient Information 2012 [place], [place].

## 2011-07-09 NOTE — Discharge Summary (Signed)
[name] [name] 07-16-1945 66 y.o. [id number]  06/26/2011   Loreli Slot, MD  CORONARY ARTERY DISEASE CP CAD   Pt. Profile: [name] is a 66yo male from [place], [place] with no prior cardiac history, PHMx significant for hyperlipidemia, hypothyroidism who was transferred to [facility] hospital on 06/26/11 from [facility] for cardiothoracic surgery evaluation after underwent elective cardiac catheterization on 06/24/11 revealing severe three-vessel CAD. The patient began to note shortness of breath increasing over time for the last 2 years. He is not a very precise historian. He states that when he first noted the shortness of breath it would be only when he was walking up an incline or stairs. About 6-8 weeks ago he noted the shortness of breath would occur with much less exertion and it were taken longer to recover. When he finally sought medical attention it was due to noticing increased swelling in his legs. At that time he was started on medications for his blood pressure. A stress test revealed anteroapical and inferolateral defects. Catheterization also revealed markedly impaired left ventricular function with an ejection fraction of 20-25%. Additionally he was found to have a 75-95% left ICA stenosis by ultrasound. He was referred to Charlett Lango M.D. for consideration of coronary artery surgical revascularization. Dr. Dorris Fetch evaluated the patient and his studies, and agreed with recommendations. The patient did require MRI study to evaluate for viability prior to proceeding. This study did reveal that the majority of compromised myocardium to be potentially viable. He was felt to be a candidate for proceeding with coronary artery surgical revascularization. He was also seen in vascular surgical consultation by Leonides Sake M.D. who evaluated his ultrasound studies and as well obtained a CT angiogram of the neck. The patient is felt at this time to require maximum  medical management of his carotid artery disease however no surgical intervention was felt required during this hospitalization.   Past Medical History   Diagnosis  Date   .  Hyperlipidemia    .  Hypertension    Hypothyroidism  No past surgical history on file.  Family History   Problem  Relation  Age of Onset   .  Heart disease  Mother  4   .  Hypertension  Brother    .  Arrhythmia  Brother     Social History: reports that he has been smoking Cigarettes. He has a 50 pack-year smoking history. He does not have any smokeless tobacco history on file. He reports that he does not drink alcohol or use illicit drugs.  Allergies: No Known Allergies  Medications:  Prior to Admission:  Prescriptions prior to admission   Medication  Sig  Dispense  Refill   .  atorvastatin (LIPITOR) 20 MG tablet  Take 20 mg by mouth at bedtime.     .  carvedilol (COREG) 6.25 MG tablet  Take 6.25 mg by mouth 2 (two) times daily with a meal.     .  furosemide (LASIX) 40 MG tablet  Take 40 mg by mouth daily.     Marland Kitchen  levothyroxine (SYNTHROID, LEVOTHROID) 50 MCG tablet  Take 50 mcg by mouth daily.     Marland Kitchen  lisinopril (PRINIVIL,ZESTRIL) 5 MG tablet  Take 5 mg by mouth daily.     .  potassium chloride SA (K-DUR,KLOR-CON) 20 MEQ tablet  Take 20 mEq by mouth daily.     Review of Systems at time of consultation Constitutional: Positive for malaise/fatigue. Negative for fever and chills.  Respiratory: Positive for cough and shortness of breath. Negative for hemoptysis and wheezing.  Cardiovascular: Positive for orthopnea and claudication. Negative for chest pain.  Gastrointestinal: Negative.  Genitourinary: Negative.  Musculoskeletal: Positive for myalgias.  Neurological:  Nonspecific visual changes while driving, does not seem localized to one eye  Endo/Heme/Allergies: Does not bruise/bleed easily.  All other systems reviewed and are negative.   Blood pressure 139/90, pulse 62, temperature 97.5 F (36.4 C),  temperature source Oral, resp. rate 18, height 6' (1.829 m), weight 153 lb (69.4 kg), SpO2 100.00%.  Physical Exam at time of consultation Vitals reviewed.  Constitutional: He is oriented to person, place, and time. He appears well-developed and well-nourished. No distress.  HENT:  Head: Normocephalic and atraumatic.  Eyes: EOM are normal. Pupils are equal, round, and reactive to light.  Neck: Neck supple. No thyromegaly present.  + carotid bruits bilaterally, right > left  Cardiovascular: Normal rate and regular rhythm. Exam reveals gallop (+ S4).  No murmur heard.  Respiratory: Effort normal. He has no wheezes. He has no rales.  Diminished BS bilaterally  GI: Soft. There is no tenderness.  Lymphadenopathy:  He has no cervical adenopathy.  Neurological: He is alert and oriented to person, place, and time. No cranial nerve deficit.  No focal motor deficits  Skin: Skin is warm and dry.   Cardiac Cath films reviewed- findings as previously noted     The patient was felt to be medically stable to proceed with CABG and on 06/30/2011 he underwent the following procedure.   OPERATIVE REPORT  PREOPERATIVE DIAGNOSIS: Left main and severe three-vessel coronary  artery disease with severe ischemic cardiomyopathy.  POSTOPERATIVE DIAGNOSIS: Left main and severe three-vessel coronary  artery disease with severe ischemic cardiomyopathy.  PROCEDURE: Median sternotomy, extracorporeal circulation, coronary  artery bypass grafting x5 (saphenous vein graft to left anterior  descending artery, saphenous vein graft to second diagonal, saphenous  vein graft sequentially to the first diagonal and obtuse marginal 1,  saphenous vein graft to posterior descending), endoscopic vein harvest,  right leg and left thigh.  SURGEON: Salvatore Decent. Dorris Fetch, MD  ASSISTANT: Coral Ceo, PA  SECOND ASSISTANT: Lowella Dandy, PA  ANESTHESIA: General.  FINDINGS: Left internal mammary artery not usable as pedicle or  as free  graft, vein fair quality, targets poor quality, severe left ventricular  hypertrophy and cardiomegaly with left ventricular dilatation. Global  hypokinesis with apical akinesis. Improved left ventricular wall motion  postbypass, rash noted on legs during vein harvest and noted on full  body postoperatively.  The patient was transported from the operating room to the surgical intensive care unit in fair condition    Postoperative hospital course:  The patient has overall done well. He was weaned from the ventilator without difficulty, although he did require ventilatory support overnight as he had significant inotropic needs as well as postoperative coagulopathy an acute blood loss. The drips were weaned over time without significant difficulty and he is maintained stable hemodynamics. He did have a a fair amount of volume overload requiring initially Lasix drip as well as continued diuretic. Anemia and thrombocytopenia have improved over time with transfusion. All routine lines, monitors and drainage devices have been discontinued in the standard manner. He did have some acute renal insufficiency secondary to the long pump run as well as relative hypotension in the setting of renal artery stenosis and ATN . This has improved significantly over time and most recent creatinine on 07/08/2011 is 1.27. He has had a  significant amount of deconditioning both chronic and acute which has improved over time with physical therapy and cardiac rehabilitation. He had postoperative hyponatremia which has improved over time felt related to free water excess. He was kept on dopamine for several days but was weaned off without difficulty. He has had no significant cardiac dysrhythmias. He has required aggressive pulmonary toilet which is improving with time and incentive spirometry. Incisions are healing well without evidence of infection. He is tolerating diet .tentatively he is felt to be stable for discharge in  the next 24 hours or so. Of note the patient is slightly jaundice with a total bilirubin of 2 and normal liver function studies except slightly elevated LDH of 261. This will be reviewed prior to final decision of discharge.   Basename 07/08/11 0625 07/07/11 0440  NA 134* 135  K 4.0 3.8  CL 96 95*  CO2 24 27  GLUCOSE 93 81  BUN 34* 38*  CALCIUM 8.5 8.2*    Basename 07/08/11 0625 07/07/11 0440  WBC 14.2* 10.1  HGB 11.8* 10.0*  HCT 34.0* 28.6*  PLT 333 222   No results found for this basename: INR:2 in the last 72 hours   Discharge Instructions:  The patient is discharged to home with extensive instructions on wound care and progressive ambulation.  They are instructed not to drive or perform any heavy lifting until returning to see the physician in his office.  Discharge Diagnosis:  CORONARY ARTERY DISEASE CP CAD Acute blood loss anemia Postoperative thrombocytopenia Acute renal insufficiency postoperative-resolved  Secondary Diagnosis: Patient Active Problem List  Diagnosis  . CAD (coronary artery disease)  . Ischemic cardiomyopathy  . Hyperlipidemia  . Hypertension  . Carotid artery disease  . Tobacco abuse  . Hypothyroidism   Past Medical History  Diagnosis Date  . Hyperlipidemia   . Hypertension   . Coronary artery disease   . Peripheral vascular disease 06/26/11    "blockages"  . 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 Lady Gary thinks I've had 2 that I didn't know about"  . Shortness of breath 06/26/11    "all the time for a good while"  . Pneumonia 1950-1960's  . Hypothyroidism        [place], [place]  Home Medication Instructions [id number]   Printed on:07/09/11 9562  Medication Information                    furosemide (LASIX) 40 MG tablet Take 40 mg by mouth daily.           potassium chloride SA (K-DUR,KLOR-CON) 20 MEQ tablet Take 20 mEq by mouth daily.           lisinopril (PRINIVIL,ZESTRIL) 5 MG tablet Take 5 mg by mouth daily.            levothyroxine (SYNTHROID, LEVOTHROID) 50 MCG tablet Take 50 mcg by mouth daily.           atorvastatin (LIPITOR) 20 MG tablet Take 20 mg by mouth at bedtime.           aspirin EC 325 MG EC tablet Take 1 tablet (325 mg total) by mouth daily.           carvedilol (COREG) 3.125 MG tablet Take 1 tablet (3.125 mg total) by mouth 2 (two) times daily with a meal.           oxyCODONE (OXY IR/ROXICODONE) 5 MG immediate release tablet Take 1-2 tablets (5-10 mg total) by  mouth every 4 (four) hours as needed.           traMADol (ULTRAM) 50 MG tablet Take 1-2 tablets (50-100 mg total) by mouth every 4 (four) hours as needed.             Disposition:  per discharge home. Please see a AVS for followup.  Patient's condition is Good  Gershon Crane, PA-C 07/09/2011  9:09 AM

## 2011-07-09 NOTE — Progress Notes (Signed)
Awaiting SNF authorization from [facility]. At this time it is unclear if they will approve SNF. I have updated patient and his son, Lonni Fix [phone]. If [facility] declines SNF patient will need to consider home with family, HH -vs- paying out of pocket for SNF care.  Reece Levy, MSW, Theresia Majors [phone]

## 2011-07-09 NOTE — Progress Notes (Addendum)
9 Days Post-Op Procedure(s) (LRB): CORONARY ARTERY BYPASS GRAFTING (CABG) (N/A)  Subjective: Patient denies shortness of breath or chest pain. Wants to go home today.  Objective: Vital signs in last 24 hours: Patient Vitals for the past 24 hrs:  BP Temp Temp src Pulse Resp SpO2 Weight  07/09/11 0500 - - - 65  - - -  07/09/11 0451 116/69 mmHg 97 F (36.1 C) Oral - 20  98 % 165 lb 11.2 oz (75.161 kg)  07/08/11 2149 122/50 mmHg 97.4 F (36.3 C) Oral 66  18  97 % -  07/08/11 1959 - - - - - 99 % -  07/08/11 1345 112/70 mmHg 98.2 F (36.8 C) Oral 61  18  99 % -   Pre op weight  75 kg Current Weight  07/09/11 165 lb 11.2 oz (75.161 kg)      Intake/Output from previous day: 06/19 0701 - 06/20 0700 In: 720 [P.O.:720] Out: 250 [Urine:250]   Physical Exam:  Cardiovascular: RRR, no murmurs, gallops, or rubs. Pulmonary: Clear to auscultation bilaterally; no rales, wheezes, or rhonchi. Abdomen: Soft, non tender, bowel sounds present. Extremities: Mild bilateral lower extremity edema. Wounds: Clean and dry.  No erythema or signs of infection.  Lab Results: CBC: Basename 07/08/11 0625 07/07/11 0440  WBC 14.2* 10.1  HGB 11.8* 10.0*  HCT 34.0* 28.6*  PLT 333 222   BMET:  Basename 07/08/11 0625 07/07/11 0440  NA 134* 135  K 4.0 3.8  CL 96 95*  CO2 24 27  GLUCOSE 93 81  BUN 34* 38*  CREATININE 1.27 1.48*  CALCIUM 8.5 8.2*   Assessment/Plan:  1. CV - SR.Continue Coreg 3.125 bid. 2.  Pulmonary - Encourage incentive spirometer. 3.  Acute blood loss anemia - Last H and H up to 11.8 and 34. 4.Appears slightly jaundiced.Total bili 2,LDH slightly elevated at 261, and LFTs within normal limits. 5.Volume overloaded-continue with daily Lasix. 6.Remove EPW. 7.Will discuss discharge disposition with surgeon.   ZIMMERMAN,DONIELLE MPA-C 07/09/2011  Patient seen and examined. He does still have 2+ edema in LE OK for d/c to SNF today

## 2011-07-09 NOTE — Progress Notes (Signed)
CARDIAC REHAB PHASE I   PRE:  Rate/Rhythm: 71SR  BP:  Supine:   Sitting:   Standing:    SaO2: 99%RA  MODE:  Ambulation: 300 ft   POST:  Rate/Rhythem: 79SR  BP:  Supine:   Sitting:   Standing: 110/60   SaO2: 97%RA 1220-1300 Pt walked 300 ft on RA with rolling walker and minimal asst. Tolerated well. Education completed. Declined CRP2. To recliner after walk. Encouraged daily weights and watching salt.  Duanne Limerick

## 2011-07-09 NOTE — Progress Notes (Signed)
Physical Therapy Treatment Patient Details Name: PRASHANT GLOSSER MRN: [id number] DOB: [birth year] Today's Date: 07/09/2011 Time: 4098-1191 PT Time Calculation (min): 24 min  PT Assessment / Plan / Recommendation Comments on Treatment Session  Pt s/p CABG with improved mobility but continued decreased gait speed. Pt encouraged to continue ambulating and increasing stride length. Pt educated for all sternal precautions. Will continue to follow.    Follow Up Recommendations       Barriers to Discharge        Equipment Recommendations       Recommendations for Other Services    Frequency     Plan Discharge plan remains appropriate    Precautions / Restrictions Precautions Precautions: Sternal Precaution Comments: Pt able to recall 3 precautions   Pertinent Vitals/Pain 72-79 throughout, sats 98% on RA    Mobility  Bed Mobility Bed Mobility: Rolling Right;Right Sidelying to Sit;Sit to Sidelying Right Rolling Right: 5: Supervision Right Sidelying to Sit: 5: Supervision;HOB flat Sit to Sidelying Right: 5: Supervision;HOB flat Details for Bed Mobility Assistance: cueing for sequence to maintain precautions Transfers Transfers: Sit to Stand;Stand to Sit;Stand Pivot Transfers Sit to Stand: 5: Supervision;From bed;From chair/3-in-1 Stand to Sit: 5: Supervision;To chair/3-in-1;To bed Stand Pivot Transfers: 6: Modified independent (Device/Increase time) Details for Transfer Assistance: cueing for hand placement Ambulation/Gait Ambulation/Gait Assistance: 5: Supervision Ambulation Distance (Feet): 300 Feet Assistive device: Rolling walker Ambulation/Gait Assistance Details: cueing to step into RW, increase stride Gait Pattern: Decreased stride length Gait velocity: decreased Stairs: No (not attempted, pt planning on SNF now)    Exercises General Exercises - Lower Extremity Long Arc Quad: AROM;Both;15 reps;Seated Hip Flexion/Marching: AROM;Both;15 reps;Seated   PT Diagnosis:      PT Problem List:   PT Treatment Interventions:     PT Goals Acute Rehab PT Goals Pt will go Supine/Side to Sit: with modified independence PT Goal: Supine/Side to Sit - Progress: Updated due to goal met PT Goal: Sit to Stand - Progress: Progressing toward goal PT Transfer Goal: Bed to Chair/Chair to Bed - Progress: Met Pt will Ambulate: >150 feet;with modified independence;with least restrictive assistive device PT Goal: Ambulate - Progress: Updated due to goal met Additional Goals PT Goal: Additional Goal #1 - Progress: Progressing toward goal  Visit Information  Last PT Received On: 07/09/11 Assistance Needed: +1    Subjective Data  Subjective: I feel like I'm doing a lot better   Cognition  Overall Cognitive Status: Appears within functional limits for tasks assessed/performed Arousal/Alertness: Awake/alert Orientation Level: Appears intact for tasks assessed Behavior During Session: [facility] for tasks performed Memory: Decreased recall of precautions    Balance     End of Session PT - End of Session Equipment Utilized During Treatment: Gait belt Activity Tolerance: Patient tolerated treatment well Patient left: in chair;with call bell/phone within reach;with family/visitor present Nurse Communication: Mobility status    Delorse Lek 07/09/2011, 11:19 AM Delaney Meigs, PT [phone]

## 2011-07-09 NOTE — Progress Notes (Signed)
Pt ambulated 137ft with rolling walker, tolerated well.

## 2011-07-09 NOTE — Progress Notes (Signed)
EPWs removed per protocol, no ectopy or other problem noted at this time.  Instructed on 1 hr of bedrest.  Will continue to monitor.

## 2011-07-09 NOTE — Progress Notes (Signed)
Spoke to patient and his friend at bedside and with son via phone- encouraged them to work on a backup d/c plan for 24 hour care in case [employer] denies SNF. Will update RNCM as well Reece Levy, MSW, [place] [phone]

## 2011-07-10 NOTE — Progress Notes (Addendum)
10 Days Post-Op Procedure(s) (LRB): CORONARY ARTERY BYPASS GRAFTING (CABG) (N/A) Subjective:  Mr. Code has no new complaints this morning.  We are waiting for insurance authorizaton  Objective: Vital signs in last 24 hours: Temp:  [97.4 F (36.3 C)-99.1 F (37.3 C)] 98.7 F (37.1 C) (06/21 0436) Pulse Rate:  [67-79] 70  (06/21 0436) Cardiac Rhythm:  [-] Normal sinus rhythm (06/20 2000) Resp:  [16-18] 18  (06/21 0436) BP: (113-134)/(47-73) 113/64 mmHg (06/21 0436) SpO2:  [96 %-98 %] 97 % (06/21 0436) Weight:  [161 lb 9.6 oz (73.301 kg)] 161 lb 9.6 oz (73.301 kg) (06/21 0436)  Intake/Output from previous day: 06/20 0701 - 06/21 0700 In: 720 [P.O.:720] Out: 100 [Urine:100]  General appearance: alert, cooperative and no distress Heart: regular rate and rhythm Lungs: clear to auscultation bilaterally Abdomen: soft, non-tender; bowel sounds normal; no masses,  no organomegaly Extremities: edema trace Wound: clean and dry  Lab Results:  [facility] 07/08/11 0625  WBC 14.2*  HGB 11.8*  HCT 34.0*  PLT 333   BMET:  Basename 07/08/11 0625  NA 134*  K 4.0  CL 96  CO2 24  GLUCOSE 93  BUN 34*  CREATININE 1.27  CALCIUM 8.5    PT/INR: No results found for this basename: LABPROT,INR in the last 72 hours ABG    Component Value Date/Time   PHART 7.426 07/01/2011 0948   HCO3 24.6* 07/01/2011 0948   TCO2 25 07/03/2011 1732   ACIDBASEDEF 1.0 06/30/2011 2155   O2SAT 98.0 07/01/2011 0948   CBG (last 3)   Basename 07/09/11 1621 07/09/11 1118  GLUCAP 113* 99    Assessment/Plan: S/P Procedure(s) (LRB): CORONARY ARTERY BYPASS GRAFTING (CABG) (N/A)  2. CV- NSR, on Coreg 3. Pulmonary- no issues, continue IS at discharged 4. Volume Overloaded- will give short course of lasix at discharge 5. Dispo- patient waiting insurance authorization for d/c to SNF, hopefully approved today   LOS: 14 days    Raford Pitcher, ERIN 07/10/2011  Patient seen and examined. Agree with above

## 2011-08-07 ENCOUNTER — Other Ambulatory Visit: Payer: Self-pay | Admitting: Thoracic Surgery (Cardiothoracic Vascular Surgery)

## 2011-08-07 DIAGNOSIS — I2510 Atherosclerotic heart disease of native coronary artery without angina pectoris: Secondary | ICD-10-CM

## 2011-08-11 ENCOUNTER — Ambulatory Visit
Admission: RE | Admit: 2011-08-11 | Discharge: 2011-08-11 | Disposition: A | Discharge status: Home / Self Care | Payer: Medicare HMO | Source: Ambulatory Visit | Attending: Thoracic Surgery (Cardiothoracic Vascular Surgery) | Admitting: Thoracic Surgery (Cardiothoracic Vascular Surgery)

## 2011-08-11 ENCOUNTER — Encounter: Payer: Self-pay | Admitting: Thoracic Surgery (Cardiothoracic Vascular Surgery)

## 2011-08-11 ENCOUNTER — Ambulatory Visit (INDEPENDENT_AMBULATORY_CARE_PROVIDER_SITE_OTHER): Payer: Self-pay | Admitting: Thoracic Surgery (Cardiothoracic Vascular Surgery)

## 2011-08-11 VITALS — BP 117/51 | HR 74 | Temp 96.9°F | Resp 18 | Ht 72.0 in | Wt 138.0 lb

## 2011-08-11 DIAGNOSIS — I2510 Atherosclerotic heart disease of native coronary artery without angina pectoris: Secondary | ICD-10-CM

## 2011-08-11 DIAGNOSIS — Z951 Presence of aortocoronary bypass graft: Secondary | ICD-10-CM

## 2011-08-11 NOTE — Progress Notes (Signed)
  HPI:  [name] returns today for a scheduled postoperative followup visit. He a coronary bypass grafting x5 on 06/30/2011. At the time of surgery he was noted to have diffusely diseased coronaries. His LIMA was unusable due to diffuse atherosclerotic disease, so he had all vein grafting. He also has ischemic cardiomyopathy with ejection fraction of 20-25%.  His primary complaint today is poor appetite. He says food taste like "cardboard". He has been using some protein supplements, but has lost a significant amount of weight. Has not had any problems with chest pain, shortness of breath or swelling in his legs. He is not using any narcotic pain medication.  Past Medical History  Diagnosis Date  . Hyperlipidemia   . Hypertension   . Coronary artery disease   . Peripheral vascular disease 06/26/11    "blockages"  . 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 Lady Gary thinks I've had 2 that I didn't know about"  . Shortness of breath 06/26/11    "all the time for a good while"  . Pneumonia 1950-1960's  . Hypothyroidism       Current Outpatient Prescriptions  Medication Sig Dispense Refill  . atorvastatin (LIPITOR) 20 MG tablet Take 20 mg by mouth at bedtime.      . carvedilol (COREG) 3.125 MG tablet Take 1 tablet (3.125 mg total) by mouth 2 (two) times daily with a meal.  60 tablet  1  . levothyroxine (SYNTHROID, LEVOTHROID) 50 MCG tablet Take 50 mcg by mouth daily.      Marland Kitchen lisinopril (PRINIVIL,ZESTRIL) 5 MG tablet Take 5 mg by mouth daily.      . Rivaroxaban (XARELTO) 20 MG TABS Take 20 mg by mouth daily.        Physical Exam BP 117/51  Pulse 74  Temp 96.9 F (36.1 C) (Oral)  Resp 18  Ht 6' (1.829 m)  Wt 138 lb (62.596 kg)  BMI 18.72 kg/m2  SpO2 98% Gen. thin 66 year old male in no acute distress Cardiac regular rate and rhythm, positive S3 Lungs diminished but equal breath sounds bilaterally No peripheral edema Sternum stable Incisions healing well  Diagnostic  Tests: Chest x-ray done today shows cardiomegaly and tiny bilateral pleural effusions  Impression: 66 year old gentleman with severe three-vessel coronary disease and ischemic cardiomyopathy, who is status post coronary bypass grafting x5 on 06/30/2011. From a cardiac standpoint he's doing well and he has no signs of congestive heart failure. His exercise tolerance is fair. Pain control is good and is not requiring narcotics. The biggest issue is poor appetite and weight loss.  I recommended that he hold Lasix and potassium. He should weigh himself on a daily basis. If his weight increases more than 3 pounds on the day or 5 pounds in 3 days and he should restart the Lasix and potassium. He should continue his other medications.  There no restrictions on his activities at this time, but he was advised to build into the new activities gradually. He was encouraged to begin cardiac rehabilitation. He may drive, appropriate precautions were discussed. He has already been driving on a limited basis without difficulty.  Plan: He will continue to followup with Dr. Harold Hedge in [place] I will be happy to see him back at any time that can be of any further assistance with his care.

## 2011-09-06 ENCOUNTER — Other Ambulatory Visit: Payer: Self-pay | Admitting: Physician Assistant

## 2011-09-09 ENCOUNTER — Other Ambulatory Visit: Payer: Self-pay | Admitting: Physician Assistant

## 2011-09-10 ENCOUNTER — Ambulatory Visit: Payer: Self-pay | Admitting: Cardiology

## 2011-09-11 ENCOUNTER — Other Ambulatory Visit: Payer: Self-pay | Admitting: Physician Assistant

## 2011-11-24 ENCOUNTER — Ambulatory Visit: Payer: Self-pay | Admitting: Vascular Surgery

## 2011-11-24 LAB — CREATININE, SERUM
Creatinine: 1.62 mg/dL — ABNORMAL HIGH (ref 0.60–1.30)
EGFR (African American): 51 — ABNORMAL LOW

## 2012-05-24 ENCOUNTER — Ambulatory Visit: Payer: Self-pay | Admitting: Vascular Surgery

## 2012-05-24 LAB — CREATININE, SERUM
Creatinine: 1.64 mg/dL — ABNORMAL HIGH (ref 0.60–1.30)
EGFR (African American): 50 — ABNORMAL LOW
EGFR (Non-African Amer.): 43 — ABNORMAL LOW

## 2012-05-24 LAB — BUN: BUN: 14 mg/dL (ref 7–18)

## 2012-07-06 ENCOUNTER — Ambulatory Visit: Payer: Self-pay | Admitting: Vascular Surgery

## 2012-07-06 LAB — BASIC METABOLIC PANEL
Anion Gap: 7 (ref 7–16)
BUN: 20 mg/dL — ABNORMAL HIGH (ref 7–18)
Calcium, Total: 9 mg/dL (ref 8.5–10.1)
Chloride: 108 mmol/L — ABNORMAL HIGH (ref 98–107)
Co2: 23 mmol/L (ref 21–32)
Creatinine: 1.47 mg/dL — ABNORMAL HIGH (ref 0.60–1.30)
EGFR (African American): 57 — ABNORMAL LOW
Potassium: 5.2 mmol/L — ABNORMAL HIGH (ref 3.5–5.1)
Sodium: 138 mmol/L (ref 136–145)

## 2012-07-26 ENCOUNTER — Inpatient Hospital Stay: Payer: Self-pay | Admitting: Internal Medicine

## 2012-07-26 LAB — URINALYSIS, COMPLETE
Bilirubin,UR: NEGATIVE
Glucose,UR: NEGATIVE mg/dL (ref 0–75)
Hyaline Cast: 4
Ketone: NEGATIVE
Leukocyte Esterase: NEGATIVE
Nitrite: NEGATIVE
Ph: 5 (ref 4.5–8.0)
RBC,UR: 2 /HPF (ref 0–5)
Squamous Epithelial: <1
WBC UR: 1 /HPF (ref 0–5)

## 2012-07-26 LAB — COMPREHENSIVE METABOLIC PANEL
Anion Gap: 9 (ref 7–16)
Bilirubin,Total: 0.9 mg/dL (ref 0.2–1.0)
Calcium, Total: 8.9 mg/dL (ref 8.5–10.1)
Chloride: 105 mmol/L (ref 98–107)
Co2: 20 mmol/L — ABNORMAL LOW (ref 21–32)
Creatinine: 1.95 mg/dL — ABNORMAL HIGH (ref 0.60–1.30)
EGFR (African American): 40 — ABNORMAL LOW
EGFR (Non-African Amer.): 35 — ABNORMAL LOW
Osmolality: 274 (ref 275–301)
Potassium: 4.9 mmol/L (ref 3.5–5.1)
SGPT (ALT): 16 U/L (ref 12–78)
Sodium: 134 mmol/L — ABNORMAL LOW (ref 136–145)

## 2012-07-26 LAB — IRON AND TIBC
Iron Bind.Cap.(Total): 513 ug/dL — ABNORMAL HIGH (ref 250–450)
Iron Saturation: 4 %
Iron: 22 ug/dL — ABNORMAL LOW (ref 65–175)
Unbound Iron-Bind.Cap.: 491 ug/dL

## 2012-07-26 LAB — CBC
HCT: 22.1 % — ABNORMAL LOW (ref 40.0–52.0)
HGB: 6.6 g/dL — ABNORMAL LOW (ref 13.0–18.0)
MCH: 18.1 pg — ABNORMAL LOW (ref 26.0–34.0)
MCHC: 29.8 g/dL — ABNORMAL LOW (ref 32.0–36.0)
Platelet: 419 10*3/uL (ref 150–440)
RBC: 3.63 10*6/uL — ABNORMAL LOW (ref 4.40–5.90)
RDW: 20.3 % — ABNORMAL HIGH (ref 11.5–14.5)

## 2012-07-26 LAB — TROPONIN I: Troponin-I: 0.19 ng/mL — ABNORMAL HIGH

## 2012-07-27 LAB — CBC WITH DIFFERENTIAL/PLATELET
HGB: 8.2 g/dL — ABNORMAL LOW (ref 13.0–18.0)
MCH: 21.3 pg — ABNORMAL LOW (ref 26.0–34.0)
MCHC: 32 g/dL (ref 32.0–36.0)
MCV: 67 fL — ABNORMAL LOW (ref 80–100)
Platelet: 335 10*3/uL (ref 150–440)
RBC: 3.87 10*6/uL — ABNORMAL LOW (ref 4.40–5.90)
Segmented Neutrophils: 75 %
WBC: 5.2 10*3/uL (ref 3.8–10.6)

## 2012-07-27 LAB — BASIC METABOLIC PANEL
BUN: 30 mg/dL — ABNORMAL HIGH (ref 7–18)
Calcium, Total: 8.6 mg/dL (ref 8.5–10.1)
Chloride: 107 mmol/L (ref 98–107)
EGFR (African American): 45 — ABNORMAL LOW
EGFR (Non-African Amer.): 39 — ABNORMAL LOW
Glucose: 80 mg/dL (ref 65–99)
Osmolality: 275 (ref 275–301)

## 2012-07-28 LAB — BASIC METABOLIC PANEL
BUN: 23 mg/dL — ABNORMAL HIGH (ref 7–18)
Chloride: 108 mmol/L — ABNORMAL HIGH (ref 98–107)
Co2: 19 mmol/L — ABNORMAL LOW (ref 21–32)
EGFR (African American): 47 — ABNORMAL LOW
EGFR (Non-African Amer.): 40 — ABNORMAL LOW
Sodium: 137 mmol/L (ref 136–145)

## 2012-07-28 LAB — CBC WITH DIFFERENTIAL/PLATELET
HCT: 25.4 % — ABNORMAL LOW (ref 40.0–52.0)
HGB: 8.3 g/dL — ABNORMAL LOW (ref 13.0–18.0)
Lymphocytes: 20 %
MCH: 21.5 pg — ABNORMAL LOW (ref 26.0–34.0)
MCV: 66 fL — ABNORMAL LOW (ref 80–100)
Platelet: 319 10*3/uL (ref 150–440)
RBC: 3.84 10*6/uL — ABNORMAL LOW (ref 4.40–5.90)
RDW: 24.1 % — ABNORMAL HIGH (ref 11.5–14.5)
Segmented Neutrophils: 63 %
WBC: 5.7 10*3/uL (ref 3.8–10.6)

## 2012-07-28 LAB — TSH: Thyroid Stimulating Horm: 3.09 u[IU]/mL

## 2012-07-29 LAB — BASIC METABOLIC PANEL
Anion Gap: 7 (ref 7–16)
BUN: 19 mg/dL — ABNORMAL HIGH (ref 7–18)
Chloride: 107 mmol/L (ref 98–107)
Creatinine: 1.45 mg/dL — ABNORMAL HIGH (ref 0.60–1.30)
EGFR (African American): 58 — ABNORMAL LOW
EGFR (Non-African Amer.): 50 — ABNORMAL LOW
Osmolality: 274 (ref 275–301)
Potassium: 4.3 mmol/L (ref 3.5–5.1)
Sodium: 136 mmol/L (ref 136–145)

## 2012-07-29 LAB — CBC WITH DIFFERENTIAL/PLATELET
Eosinophil: 5 %
HCT: 27.2 % — ABNORMAL LOW (ref 40.0–52.0)
HGB: 8.4 g/dL — ABNORMAL LOW (ref 13.0–18.0)
MCH: 20.7 pg — ABNORMAL LOW (ref 26.0–34.0)
MCHC: 30.8 g/dL — ABNORMAL LOW (ref 32.0–36.0)
Platelet: 334 10*3/uL (ref 150–440)
RBC: 4.05 10*6/uL — ABNORMAL LOW (ref 4.40–5.90)
RDW: 25.5 % — ABNORMAL HIGH (ref 11.5–14.5)
Segmented Neutrophils: 74 %
WBC: 7.3 10*3/uL (ref 3.8–10.6)

## 2012-08-01 ENCOUNTER — Emergency Department: Payer: Self-pay | Admitting: Emergency Medicine

## 2012-08-01 LAB — URINALYSIS, COMPLETE
Bilirubin,UR: NEGATIVE
Blood: NEGATIVE
Glucose,UR: NEGATIVE mg/dL (ref 0–75)
Leukocyte Esterase: NEGATIVE
Nitrite: NEGATIVE
Ph: 5 (ref 4.5–8.0)
Protein: NEGATIVE
RBC,UR: 1 /HPF (ref 0–5)
Squamous Epithelial: NONE SEEN
WBC UR: 1 /HPF (ref 0–5)

## 2012-08-01 LAB — CBC
HGB: 8.1 g/dL — ABNORMAL LOW (ref 13.0–18.0)
MCH: 20.3 pg — ABNORMAL LOW (ref 26.0–34.0)
MCHC: 30.6 g/dL — ABNORMAL LOW (ref 32.0–36.0)
MCV: 67 fL — ABNORMAL LOW (ref 80–100)
Platelet: 324 10*3/uL (ref 150–440)
RBC: 3.99 10*6/uL — ABNORMAL LOW (ref 4.40–5.90)
RDW: 26.6 % — ABNORMAL HIGH (ref 11.5–14.5)

## 2012-08-01 LAB — COMPREHENSIVE METABOLIC PANEL
Albumin: 3.2 g/dL — ABNORMAL LOW (ref 3.4–5.0)
BUN: 19 mg/dL — ABNORMAL HIGH (ref 7–18)
Chloride: 108 mmol/L — ABNORMAL HIGH (ref 98–107)
EGFR (African American): 43 — ABNORMAL LOW
EGFR (Non-African Amer.): 37 — ABNORMAL LOW
Glucose: 95 mg/dL (ref 65–99)
Osmolality: 272 (ref 275–301)
Potassium: 4.2 mmol/L (ref 3.5–5.1)

## 2012-08-01 LAB — LIPASE, BLOOD: Lipase: 237 U/L (ref 73–393)

## 2012-08-12 ENCOUNTER — Ambulatory Visit: Payer: Self-pay | Admitting: Gastroenterology

## 2014-05-05 IMAGING — CR DG CHEST 2V
2 series · 2 of 2 positions shown · non-contrast
Comparison: 07/05/2011

CLINICAL DATA: Pleural effusion followup

CHEST - 2 VIEW

[w chest pa]
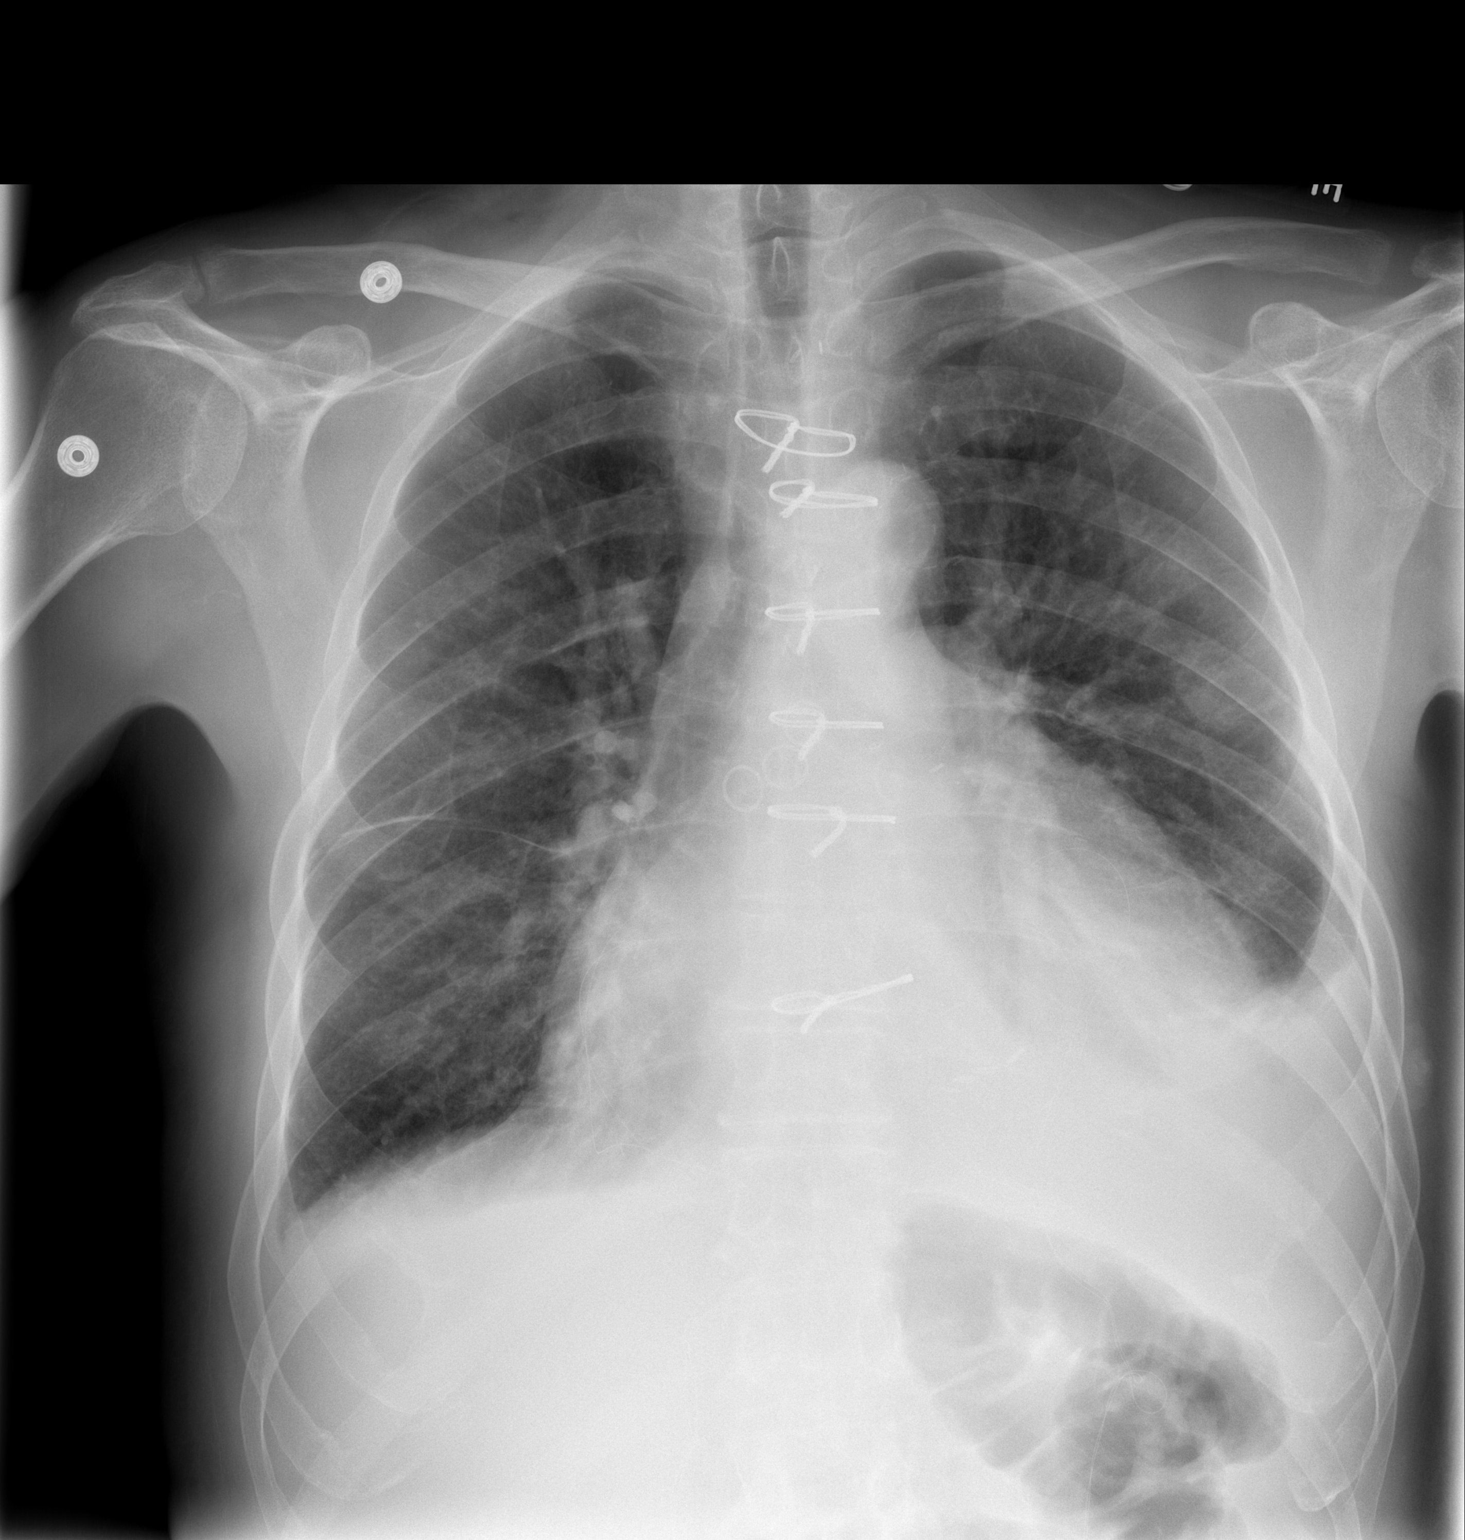

[w chest lat]
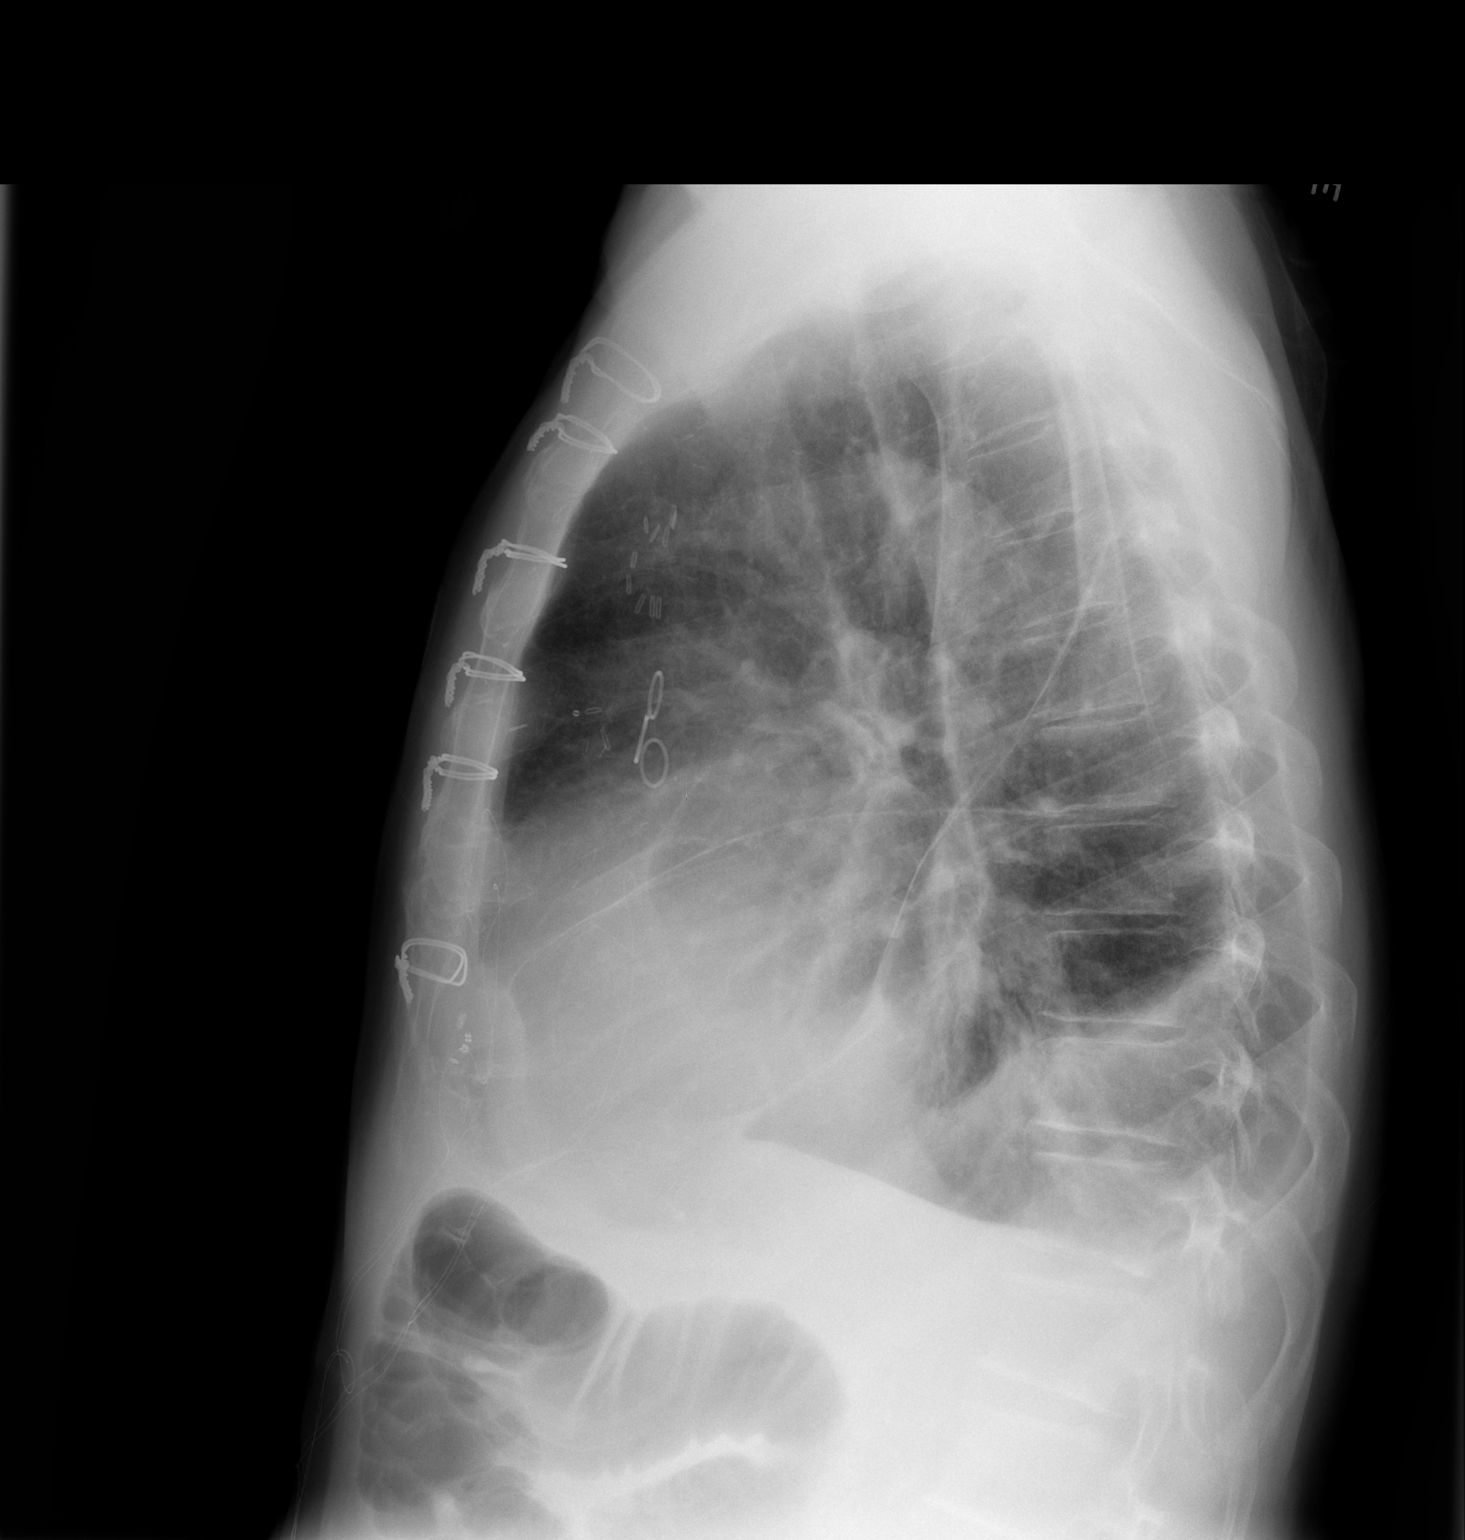

[2 of 2 positions shown; findings below may reference images not displayed]

FINDINGS: Cardiomegaly with central vascular congestion status post
median sternotomy and CABG.  Left pleural effusion is small to
moderate, increased in size in the interval.  Associated opacity;
atelectasis versus pneumonia.  Trace right pleural effusion with
associated opacity.  No pneumothorax.  No acute osseous finding.
IMPRESSION: Cardiomegaly with central vascular congestion.  Bilateral pleural
effusions, left greater than right, have increased in size in the
interval.  Associated opacity; atelectasis versus pneumonia.

## 2014-05-11 NOTE — Discharge Summary (Signed)
PATIENT NAME:  [name] [name], [name] [name] MR#:  [id number] DATE OF BIRTH:  January 08, 1946  DATE OF ADMISSION:  07/26/2012 DATE OF DISCHARGE:  07/29/2012  ADMITTING DIAGNOSIS: Weakness, shortness of breath.   DISCHARGE DIAGNOSES: 1.  Weakness due to severe anemia.  2.  Acute to subacute anemia, likely due to gastrointestinal bleed, possibly related to Xarelto treatment. Status post 2 units of packed RBCs. Hemoglobin has been stable.  3.  Iron deficiency anemia, status post EGD which shows esophagitis. The patient will need outpatient colonoscopy.  Started on iron supplements.  4.  Coronary artery disease status post 5 vessel bypass.  5.  Elevated troponin, felt to be likely demand ischemia. The patient's bypass was last year.  6.  Atrial flutter, on Xarelto.  Seen by cardiology. With severe anemia and a gastrointestinal bleed his Xarelto is discontinued. Cardiology feels that aspirin would suffice in this patient.  7.  Acute renal failure, possible chronic kidney disease.  His renal function improved with IV fluid and needs followup BMP as an outpatient.  8.  Peripheral arterial disease.  9.  Chronic obstructive pulmonary disease without any evidence of acute exacerbation.  10.  Vitamin D deficiency.  11.  Past tobacco abuse.   CONSULTANTS:  1.  Marcina Millard, MD. 2.  Lutricia Feil, MD.  PROCEDURES:  EGD which showed esophagitis.   HOSPITAL COURSE: Please refer to H and P done by the admitting physician. The patient is a 69 year old white male with history of coronary artery disease, CABG, atrial flutter, COPD and  hypertension who presented to the ED with complaint of 2 to 3 weeks of weakness and some shortness of breath. His hemoglobin was noted to be 6.6 on admission. The patient also was having on and off melena. Due to these symptoms, he was admitted for further evaluation and treatment. The patient was initially given IV fluids and transfused. Work-up revealed him to have iron deficiency anemia. He was  seen in consultation by GI.  They performed an EGD which showed some esophagitis. His hemoglobin has remained stable after the 2 units of packed RBCs. Due to his age, he will need outpatient colonoscopy, which should be done. We will arrange follow-up with gastrointestinal. In terms of his atrial fibrillation, Dr. Darrold Junker saw the patient and stated that his patient's CHADS was low enough that he would benefit from just aspirin alone. At this time, the patient's Xarelto is discontinued. He was also noted to have vitamin D deficiency so he is being replaced with vitamin D. At this time, he is doing much better, stable for discharge.   DISCHARGE MEDICATIONS:  1.  Atorvastatin 40 mg at bedtime. 2.  Coreg 3.125 mg 1 tab p.o. b.i.d.  3.  Levothyroxine 100 mcg daily. 4.  Lisinopril 5 mg daily. 5.  Acetaminophen 650 mg q. 4 hours p.r.n. for pain. 6.  Lasix 40 mg 1 tab daily as needed for significant swelling. 7.  Esomeprazole 40 mg daily. 8.  Ergocalciferol 50,000 international units once a week. 9.  Calcium plus vitamin D 1 tab p.o. b.i.d. 10.  Iron with vitamin B12, C and folic acid 1 tab p.o. daily.   DISCHARGE DIET: Low-sodium, low-fat, low-cholesterol.   DISCHARGE ACTIVITY: As tolerated.   DISCHARGE FOLLOWUP: Follow with Dr. Dewaine Oats in 1 to 2 weeks.  TIME SPENT: 35 minutes. ____________________________ Lacie Scotts Allena Katz, MD shp:sb D: 07/29/2012 14:19:46 ET T: 07/29/2012 16:27:28 ET JOB#: [id number]  cc: Zaki Gertsch H. Allena Katz, MD, <Dictator> Charise Carwin MD ELECTRONICALLY  SIGNED 08/02/2012 11:03

## 2014-05-11 NOTE — Consult Note (Signed)
No further bleeding. Off xarelto x 48hrs. EGD showed reflux esophagitis, but no active bleeding now. Due to recent xarelto use, no biopsies taken. Reg diet ordered. Continue to hold off on xarelto for now until xarelto has to be resumed later. Make sure to continue protonix daily even after discharge. Since patient refused colonoscopy, will sign off. If recurrent bleeding, then call GI on call. Thanks.  Electronic Signatures: Lutricia Feilh, Alyia Lacerte (MD)  (Signed on 10-Jul-14 10:00)  Authored  Last Updated: 10-Jul-14 10:00 by Lutricia Feilh, Tabathia Knoche (MD)

## 2014-05-11 NOTE — Consult Note (Signed)
Brief Consult Note: Diagnosis: Melena, IDA secondary to suspected chronic GI bleed.  Increase risk for bleeding given chronic anticoagulant therapy . Known history of CAD.  COPD.  Unintentional weight loss.   Consult note dictated.   Orders entered.   Discussed with Attending MD.   Comments: Patient's presentation discussed with Dr. Lutricia FeilPaul Oh.  Goal is to proceed with EGD tomorrow to allow direct luminal evaluation of upper GI tract.  Per guidelines to wait at least 48 hours from last dose of Xarelto.  Last dose was yesterday am.  Order placed. Procedure, risks and benefits discussed with patient. Continue with PPI therapy.  Continued serial monitoring of H/H.  Transfuse as necessary.  Colonoscopy discussed for the indication of colon cancer screening.  Patient declined.  Electronic Signatures: Rodman KeyHarrison, Aron Inge S (NP)  (Signed 09-Jul-14 13:32)  Authored: Brief Consult Note   Last Updated: 09-Jul-14 13:32 by Rodman KeyHarrison, Elber Galyean S (NP)

## 2014-05-11 NOTE — Consult Note (Signed)
PATIENT NAME:  [name] [name], [name] [name] MR#:  [id number] DATE OF BIRTH:  12/09/1945  DATE OF CONSULTATION:  07/27/2012  PRIMARY CARE PHYSICIAN:   Dr. [name].  PRIMARY CARDIOLOGIST:  Dr. Lady Gary.  CONSULTING PHYSICIAN:  Marcina Millard, MD  CHIEF COMPLAINT: Weakness.   REASON FOR CONSULTATION: Consultation requested for evaluation of elevated troponin.   HISTORY OF PRESENT ILLNESS: The patient is a 69 year old gentleman with known history of coronary artery disease status post bypass graft surgery. Currently taking Xarelto for atrial flutter. The patient apparently has a 2 to 3 week history of increasing generalized weakness. On 07/26/2012 a friend insisted he come to [facility] for evaluation after falling down the steps.  Admission labs were notable for a hemoglobin and hematocrit of 6.6 and 22.1, respectively. Upon questioning, the patient reports that he has been having black, tarry stools. Other notable labs included a borderline elevated troponin of 0.20. The patient denies chest pain. The patient received 2 units of red blood cells today and reports feeling better. He is scheduled to undergo upper endoscopy in the a.m.   PAST MEDICAL HISTORY: 1.  Coronary artery disease status post bypass graft surgery. 2.  Atrial flutter on Xarelto.  3.  Peripheral vascular disease status post femoral bypass surgery.  4.  Tobacco abuse.  5.  Chronic obstructive pulmonary disease.  6.  Chronic kidney disease.   MEDICATIONS ON ADMISSION:  Xarelto 20 mg daily, lisinopril 5 mg daily, carvedilol 3.125 mg b.i.d., atorvastatin 40 mg daily. Furosemide 40 mg daily p.r.n., levothyroxine 0.1 mg daily.   SOCIAL HISTORY: The patient quit tobacco abuse approximately one year ago.   FAMILY HISTORY: Notable for coronary artery disease.  REVIEW OF SYSTEMS:  CONSTITUTIONAL: The patient has had generalized fatigue and weakness.  EYES: No blurry vision.  EARS: No hearing loss.  RESPIRATORY: Has mild shortness of  breath due to underlying chronic obstructive pulmonary disease.  CARDIOVASCULAR: No chest pain.  GASTROINTESTINAL: No nausea, vomiting, diarrhea. The patient has had black, tarry schools stools and melena.  GENITOURINARY: No dysuria or hematuria.  ENDOCRINE: No polyuria, polydipsia.  INTEGUMENTARY: No rash.  HEMATOLOGICAL: No easy bruising. Does have black, tarry schools and melena as described above.  MUSCULOSKELETAL: No arthralgias or myalgias.  NEUROLOGICAL: No focal muscle weakness or numbness.  PSYCHOLOGICAL: No depression or anxiety.   PHYSICAL EXAMINATION: VITAL SIGNS: Blood pressure 137/61, pulse 64, respirations 18, temperature 97.77, pulse oximetry 98%.  HEENT: Pupils equal, reactive to light and accommodation.  NECK: Supple without thyromegaly.  LUNGS: Clear.  CARDIOVASCULAR: Normal JVP. Normal PMI. Regular rate and rhythm. Normal S1, S2. No appreciable gallop, murmur, or rub.  ABDOMEN: Soft and nontender. Pulses were intact bilaterally.  MUSCULOSKELETAL: Normal muscle tone.  NEUROLOGIC: The patient is alert and oriented x3. Motor and sensory both grossly intact.   IMPRESSION: A 69 year old gentleman who presents with 2 to 3 week history of increasing generalized weakness and fatigue noted to be markedly anemic with active gastrointestinal bleed on Xarelto. The patient does have borderline elevated troponin, likely due to demand and supply ischemia and not due to acute myocardial infarction or acute coronary syndrome.   RECOMMENDATIONS: 1.  Agree with overall current therapy.  2.  Agree holding Xarelto.  3.  Agree proceeding with GI work-up as planned.  4.  Defer noninvasive or invasive cardiac diagnostics at this time.    ____________________________ Marcina Millard, MD ap:dp D: 07/27/2012 16:34:40 ET T: 07/27/2012 17:38:13 ET JOB#: [id number]  cc: Marcina Millard, MD, <Dictator>  Marcina MillardALEXANDER Leandria Thier MD ELECTRONICALLY SIGNED 08/09/2012 12:29

## 2014-05-11 NOTE — Consult Note (Signed)
Pt seen and examined. Please see Dawn Harrison's notes. Pt on xarelto. Had melena with drop in h gb. Given blood transfusions last night. Last xarelto dose yest AM. Normally, would like 48hrs off before proceeding with endoscopies. Will plan EGD tomorrow afternoon. Thanks.  Electronic Signatures: Lutricia Feilh, Machai Desmith (MD)  (Signed on 09-Jul-14 15:35)  Authored  Last Updated: 09-Jul-14 15:35 by Lutricia Feilh, Vernell Back (MD)

## 2014-05-11 NOTE — Consult Note (Signed)
Brief Consult Note: Diagnosis: Borderline elevated troponin, demand supply without MI, secondary to GIB.   Patient was seen by consultant.   Consult note dictated.   Comments: REC  Agree wtih current plan, hold rivaroxaban, proceed with GI w/u, no further cardiac diagnostics at this time.  Electronic Signatures: Marcina MillardParaschos, Zakyia Gagan (MD)  (Signed 09-Jul-14 16:37)  Authored: Brief Consult Note   Last Updated: 09-Jul-14 16:37 by Marcina MillardParaschos, Twilla Khouri (MD)

## 2014-05-11 NOTE — Op Note (Signed)
PATIENT NAME:  [name] [name], [name] [name] MR#:  [id number] DATE OF BIRTH:  1945-04-14  DATE OF PROCEDURE:  05/24/2012  PREOPERATIVE DIAGNOSIS: Atherosclerotic occlusive disease, bilateral lower extremities, with rest pain of the right lower extremity.   POSTOPERATIVE DIAGNOSIS:  Atherosclerotic occlusive disease, bilateral lower extremities, with rest pain of the right lower extremity.   PROCEDURES PERFORMED: 1.  Abdominal aortogram.  2.  Crosser atherectomy, right superficial femoral artery.  3.  Percutaneous transluminal angioplasty, right external iliac artery.   SURGEON: Renford Dills, M.D.   SEDATION: Precedex drip.   CONTRAST USED: Isovue 74 mL.   FLUORO TIME: 25.6 minutes.   INDICATIONS: [name] [name] is a 69 year old gentleman with known extensive atherosclerotic occlusive disease. He presented to the office with increasing pain in his right lower extremity. Physical examination as well as noninvasive studies demonstrated significant atherosclerotic occlusive disease, and he was therefore taken to the special procedures suite for attempted  revascularization.   Risks and benefits were reviewed. All questions answered. The patient agrees to proceed.   PROCEDURE: The patient was taken to special procedures and placed in the supine position. After adequate sedation was achieved, both groins were prepped and draped in sterile fashion. Ultrasound was placed in a sterile sleeve. Ultrasound was utilized secondary to lack of appropriate landmarks and to avoid vascular injury. Under direct ultrasound visualization, access to the left common femoral artery was obtained with a micropuncture needle, a MicroWire followed by a MicroSheath, J wire followed by a 5-French sheath and 5 French pigtail catheter. The pigtail catheter was positioned at the level of T12, and AP projection of the aorta was obtained. Pigtail catheter was then repositioned to above the bifurcation and LAO projection of the pelvis was  obtained. A stiff angled Glidewire and pigtail catheter were then used to cross the aortic bifurcation. The catheter was advanced down to the distal external iliac, where a string sign was noted extending from the last 2 cm of the external iliac across the very proximal common femoral. The circumflex vessels are located with their origins within the mid-portion of this strictured stenosis. Therefore, 3000 units of heparin were given. Glidewires were used to negotiate through the stricture, and the Magic Torque wire was exchanged for the Glidewire via a straight catheter. Angioplasty to 4 mm was performed of this iliac lesion. Results demonstrate it was under-sized, and there was a nonflow-limiting dissection.   A stiff angled Glidewire was then reintroduced and an Ansell sheath was advanced up and over the bifurcation, 7-French in size, and positioned with its tip in the distal common femoral. Distal runoff was then obtained which demonstrates a long-segment SFA occlusion reconstitution of the at-knee popliteal and subsequently single-vessel runoff via the tibial to the foot.   An additional 1000 units of heparin was given. The S6 Crosser atherectomy catheter was prepped on the table, and then using a V-18 wire and an Usher catheter to negotiate the Usher into the SFA down to the level of the occlusion, S6 was then used in an attempt to cross. Unfortunately, in spite of multiple attempts at crossing the lesion, re-entry into the true lumen was never achieved and the further treatments of the SFA were terminated.   Attention was then returned to the iliac lesion which was evaluated by hand injection under magnified views after pulling the sheath back. Looking at this lesion, first a 5 mm diameter balloon and subsequently a 6 mm diameter balloon was then used to angioplasty this lesion, The 6  mm inflation was to 12 atmospheres for 2 full minutes. Followup angiography demonstrates significant luminal gain, less  than 10 to 15% residual stenosis. There was a nonflow-limiting dissection noted as well. In comparison to the string sign that was present initially there was dramatic improvement. Therefore, the sheath was pulled back into the left external iliac. Oblique view of the left groin was obtained and a Mynx device was attempted, however the balloon on the Mynx was pulled completely out and manual pressure was held. There were no other immediate complications.   INTERPRETATION: The abdominal aorta was opacified with a bolus injection of contrast. There was extensive atherosclerotic occlusive disease, however there are no hemodynamically significant stenoses. There are stents placed in both common iliacs as well as the left external iliac. These appear widely patent. On the right side the external iliac in its distal several centimeters has a string sign. Ultimately this has resolved with angioplasty alone up to a maximal diameter of 6 mm.   The common femoral and profunda femoris are patent. Superficial femoral artery occludes in its mid-portion, and there is reconstitution of the mid-popliteal and signal vessel runoff to the foot. Attempts at crossing the SFA lesion with the Crosser catheter were not successful, and given the improvement in his inflow with treatment of the iliac lesion further treatment was stopped at this point.   Plan will be to return him to the special procedure suite use the 14-S device after his SFA has had a chance to heal.    ____________________________ Renford DillsGregory G. Lynne Righi, MD ggs:dm D: 05/25/2012 10:42:22 ET T: 05/25/2012 11:50:13 ET JOB#: [id number]  cc: Renford DillsGregory G. Katiya Fike, MD, <Dictator> Jillene Bucksenny C. Arlana Pouchate, MD Renford DillsGREGORY G Damarkus Balis MD ELECTRONICALLY SIGNED 05/30/2012 13:51

## 2014-05-11 NOTE — Consult Note (Signed)
PATIENT NAME:  [name] [name], [name] [name] MR#:  [id number] DATE OF BIRTH:  January 22, 1945  DATE OF CONSULTATION:  07/27/2012  REFERRING PHYSICIAN:  Srikar R. Sudini, MD CONSULTING PHYSICIAN:  Payton Emerald, NP  CONSULTATION FOR: [name] [name], [name] Vista Candace Cruise, MD  PRIMARY CARE PHYSICIAN: Leona Carry. Hall Busing, MD  PRIMARY CARDIOLOGIST: Javier Docker. Ubaldo Glassing, MD  REASON FOR CONSULTATION: Melena.   HISTORY OF PRESENT ILLNESS: [name] [name] is a 69 year old Caucasian gentleman with a known history of coronary artery disease, status post 5-vessel bypass surgery in 2013, under the care of Dr. Bartholome Bill, atrial flutter, was taking Xarelto prior to admission, COPD, hypertension and history of tobacco abuse, quit in May 2013. He has had a 2- to 3-week history of extreme fatigue, actually sustained a fall at home since having bypass surgery done last year. He has had a 40-pound unintentional weight loss. States he has had no appetite, living mostly on ice cream. No associated nausea, no vomiting. No heartburn. No reflux. No dysphagia. For the past couple weeks, stool has been black in color intermittently, denies being tarry. No NSAID use or aspirin in correlation with the Xarelto. Last dose of Xarelto was yesterday morning. The patient was brought to the Emergency Room by his friends due to the fatigue that he has been having. He was found to be hypotensive with a blood pressure of 100/50, hemoglobin 6.6. Rectal exam did reveal stool to be heme positive. No history of EGD during his lifetime or colonoscopy. Has been experiencing shortness of breath with exertion, which according to note, is chronic, felt to be in correlation with his known history of COPD as well as coronary artery disease.   PAST MEDICAL HISTORY:  1. Coronary artery disease, 5-vessel bypass surgery done June 2013. 2. Peripheral arterial disease, status post femoral bypass twice to the right leg and pending left leg, under the care of Dr. Delana Meyer. 3. Atrial flutter.  4.  COPD. 5. Tobacco abuse, quit in May 2013. 6. Chronic renal disease/insufficiency, stage III, with a baseline creatinine of 1.5.   PAST SURGICAL HISTORY:  1. Tonsils.  2. Femoral bypass twice to the right leg.  3. CABG, 5-vessel bypass in June 2013.   FAMILY HISTORY: Coronary artery disease, hypertension. No cancer.   SOCIAL HISTORY: Remote tobacco use, quit a year ago. No recent alcohol use for the past 40 years. Denies heavy use in the past. No recreational drug use. Resides by himself.   HOME MEDICATIONS:  1. Lisinopril 5 mg once a day. 2. Coreg 3.125 mg twice a day.  3. Levothyroxine 0.1 mg once a day. 4. Xarelto 20 mg once a day. 5. Atorvastatin 40 mg once a day. 6. Lasix 40 mg once a day as needed for lower extremity edema.   ALLERGIES: None.   REVIEW OF SYSTEMS:  CONSTITUTIONAL: Significant for fatigue and weakness. Significant for weight loss. No fevers.  EYES: Significant for mild blurred vision. No double vision.  EARS, NOSE AND THROAT: No tinnitus. No hearing loss.  RESPIRATORY: No coughing, no wheezing. Chronic dyspnea with exertion.  CARDIOVASCULAR: No chest pain.  GASTROINTESTINAL: See HPI.  GENITOURINARY: No dysuria or hematuria.  ENDOCRINE: No polyuria, nocturia, thyroid problems.  HEMATOLOGIC AND LYMPHATIC: Significant for anemia. Denies easy bruising or bleeding.  INTEGUMENTARY: No rashes. No lesions.  MUSCULOSKELETAL: No back pain. No arthralgias.  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 No history of CVA.  PSYCHIATRIC: No depression, no anxiety.   PHYSICAL EXAMINATION:  VITAL SIGNS: Temperature is 97.7, pulse is 64, respirations are 18, blood pressure is 137/61 with a pulse oximetry of 98%.  GENERAL: Well-developed slender 69 year old Caucasian gentleman, no acute distress noted. Resting comfortably in bed.  HEENT: Normocephalic, atraumatic. Pupils equally reactive to light. Conjunctivae clear. Sclerae  anicteric.  NECK: Supple. Trachea midline. No lymphadenopathy or thyromegaly.  PULMONARY: Symmetric rise and fall of chest. Clear to auscultation throughout.  CARDIOVASCULAR: Regular rhythm, S1, S2. No murmurs, no gallops.  ABDOMEN: Flat, nondistended. Bowel sounds in 4 quadrants. No bruits. No masses. No evidence of hepatosplenomegaly.  RECTAL: Deferred, performed in the Emergency Room, heme-positive.  MUSCULOSKELETAL: Moving all 4 extremities. No contractures. No clubbing.  SKIN: Warm, dry. Color pale. No lesions. No rashes.  NEUROLOGICAL: No gross neurological deficits.  PSYCHIATRIC: Alert and oriented x 4. Memory grossly intact.  EXTREMITIES: No edema.   LABORATORY AND DIAGNOSTIC DATA: Chemistry panel on admission: BUN is 31, creatinine is 1.95, sodium was 134, CO2 was low at 20, EGFR was 35. Serum iron was 22 with TIBC of 513. Iron saturation is 4. Today, creatinine has improved to 1.77, BUN has improved to 30. CO2 dropped further to 18, and EGFR at 45. Calcium is 8.6 today.   Hepatic panel: Alkaline phosphatase elevated at 154. Troponin was 0.9 on admission, and today is 0.2. CBC: WBC count was 8.1, RBC was 3.63, hemoglobin 6.6, hematocrit of 22.1, MCV 61, MCH of 18.1, MCHC is 29.8, RDW 20.3. After 2 units of packed red blood cells given, hemoglobin has risen to 8.2 with hematocrit of 25.7. Urinalysis revealed +1 blood, otherwise essentially unremarkable. An EKG showed normal sinus rhythm with a right bundle branch block.   IMPRESSION:  1. Melena, suspect upper gastrointestinal bleed. The patient with increased risk given chronic anticoagulant therapy.  2. Known history of coronary artery disease.  3. Iron deficiency anemia secondary to suspected chronic upper gastrointestinal bleed.  4. Unintentional weight loss.  5. Chronic obstructive pulmonary disease.   PLAN: The patient's presentation was discussed with Dr. Verdie Shire. Recommendation is to proceed forward with an upper endoscopy to  allow direct luminal evaluation of upper gastrointestinal tract. Goal is to proceed with upper endoscopy tomorrow. Order placed. N.p.o. after midnight. The patient to remain on PPI therapy at this time. Xarelto to continue to be held. Recommend serial monitoring of H and H. Transfuse as necessary. I did discuss with the patient consideration of proceeding forward with a colonoscopy for indication of colon cancer screening. The patient declines at this time.   These services provided by Payton Emerald, MS, APRN, [facility], FNP, under collaborative agreement with Dr. Verdie Shire.  ____________________________ Payton Emerald, NP dsh:OSi D: 07/27/2012 13:29:18 ET T: 07/27/2012 14:07:29 ET JOB#: [id number]  cc: Payton Emerald, NP, <Dictator> Payton Emerald MD ELECTRONICALLY SIGNED 08/02/2012 8:17

## 2014-05-11 NOTE — H&P (Signed)
PATIENT NAME:  [name] [name], [name] [name] MR#:  [id number] DATE OF BIRTH:  [birth year]  DATE OF ADMISSION:  07/26/2012  PRIMARY CARE PHYSICIAN:  Dr. Hall Busing  PRIMARY CARDIOLOGIST:  Dr. Ubaldo Glassing  CHIEF COMPLAINT:  Weakness of 2 to 3 weeks.   HISTORY OF PRESENT ILLNESS:  A 69 year old Caucasian male patient with history of CAD, status post CABG, atrial flutter on Xarelto, COPD, hypertension, presents to the Emergency Room complaining of 2 to 3 weeks of weakness. The patient has had this weakness. Yesterday he did have a mechanical fall while going down the steps. Did not injure himself any place. Today he met one of his friends, who insisted that patient come to the ER and get himself checked out. Here, he was found to have low-normal blood pressure of 100/50, with hemoglobin 6.6. The patient complained of melena. His stool is positive for blood, and he is being admitted to the hospitalist service for further workup and treatment.   He does complain of exertional shortness of breath, which is chronic, with his CAD and COPD, and has worsened over the last 3 weeks. No chest pain at rest or exertion. No nausea, vomiting, abdominal pain. On and off melena. No bright red blood per rectum. He is on Xarelto, does not take any aspirin. Has good appetite. Has not lost any weight.   PAST MEDICAL HISTORY: 1.  Coronary artery disease.  2.  CABG.  3.  Peripheral arterial disease, status post femoral bypass.  4.  Atrial flutter, on Xarelto.   5.  COPD.   6.  Past tobacco abuse.  7.  CKD stage III, with baseline creatinine 1.5.   FAMILY HISTORY:  Coronary artery disease, hypertension.   SOCIAL HISTORY: The patient used to smoke in the past, quit 1 year back. Does not drink alcohol. No illicit drugs. Lives alone. Ambulates on his own without a walker or cane.   CODE STATUS:  FULL CODE.   HOME MEDICATIONS INCLUDE: 1.  Lisinopril 5 mg oral once a day.  2.  Coreg 3.125 mg oral 2 times a day.  3.  Levothyroxine 0.1 mg oral  once a day. 4.  Xarelto 20 mg oral once a day.  5.  Atorvastatin 40 mg oral once a day.  6.  Lasix 40 mg oral once a day as needed for lower extremity edema.   REVIEW OF SYSTEMS:    CONSTITUTIONAL:  Complains of fatigue and weakness. No weight loss or fever.  EYES:  No blurred vision, pain or redness.  EARS, NOSE, THROAT:  No tinnitus, ear pain, hearing loss.  RESPIRATORY:  No cough, wheeze, hemoptysis. Has dyspnea on exertion.  CARDIOVASCULAR:  No chest pain, orthopnea, edema.  GASTROINTESTINAL:  No nausea, vomiting, diarrhea. Has melena.  GENITOURINARY:  No dysuria, hematuria, frequency.  ENDOCRINE: No polyuria, nocturia, thyroid problems.  HEMATOLOGIC/LYMPHATIC:  Has anemia. No easy bruising, bleeding.  INTEGUMENTARY:  No acne, rash, lesions.  MUSCULOSKELETAL:  No back pain, arthritis.  NEUROLOGIC:  No focal weakness or seizures. Does have peripheral neuropathy secondary to his peripheral arterial disease.  PSYCHIATRIC:  No anxiety or depression.   PHYSICAL EXAMINATION: VITAL SIGNS:  Pulse 72, blood pressure 100/51, presently at 103/53, saturating 98% on room air.  GENERAL: A thin, Caucasian male patient lying in bed, comfortable, conversational, cooperative with exam.  PSYCHIATRIC:  Alert, oriented x 3. Mood and affect appropriate. Judgment intact.  HEENT:  Atraumatic, normocephalic. Oral mucosa moist and pink. No oral ulcers or thrush. Pallor positive. No icterus.  Pupils bilaterally equal and react to light.  NECK:  Supple. No thyromegaly. No palpable lymph nodes. Trachea midline. No carotid bruit, JVD.  CARDIOVASCULAR:  S1, S2 with ejection systolic murmur, not radiating to the carotids. Peripheral pulses 2+. No edema.  RESPIRATORY:  Normal work of breathing. Clear to auscultation on both sides.  GASTROINTESTINAL: Soft abdomen, nontender. Bowel sounds present. No hepatosplenomegaly palpable.  RECTAL:  Exam deferred, as done in the ED.  GENITOURINARY:  No CVA tenderness or bladder  distention.  SKIN:  Warm and dry. No petechiae, rash, ulcers. Has a small excoriation around his right knee from his recent fall.  NEUROLOGICAL:  Motor strength 5/5 in upper and lower extremities. Has decreased sensation in his legs. Normal sensation in upper extremities.  LYMPHATIC:  No cervical, supraclavicular lymphadenopathy.   LAB STUDIES: Show glucose 90, BUN 31, creatinine 1.95, sodium 134, potassium 4.9, chloride 105. AST, ALT, bilirubin normal. WBC 8.1, hemoglobin 6.6, platelets of 419, MCV of 61. Urinalysis shows trace bacteria, but only 1 WBC.   EKG shows normal sinus rhythm with right bundle branch block along with ST depression in lateral leads which is chronic, but worse in V5-V6.   ASSESSMENT AND PLAN: 1.  Acute on chronic blood loss anemia with GI bleed. The patient does have upper GI bleed. Has low MCV, suggesting possibly chronic blood loss. Hemoglobin 6.6. Will transfuse 2 units of blood. Will consult GI. The patient will be n.p.o. Hold Xarelto. Check iron studies prior to transfusion. Will follow hemoglobin during the hospital stay every 6 hours.   2.  Coronary artery disease. The patient does have a mildly elevated troponin of 0.19. Likely from demand ischemia. Will consult Dr. Ubaldo Glassing to see the patient. Will check 2 more sets of cardiac enzymes. I do not think patient has non-ST elevation myocardial infarction. No chest pain or shortness of breath.   3.  Atrial flutter, on Xarelto. Xarelto is being held at this time. Continue Coreg. Presently in normal sinus rhythm. I have discussed with the patient regarding his increased risk of CVA, with hold on anticoagulation. Will await GI and Cardiology input to restart his Xarelto.   4.  Acute renal failure/chronic kidney disease. The patient's baseline creatinine seems to be around 0.5, presently at 1.93. Should improve with fluids and blood. Will hold his Lasix and lisinopril.   5.  Peripheral arterial disease, stable.   6.  Chronic  obstructive pulmonary disease, stable.   7.  Deep venous thrombosis prophylaxis. Sequential compression devices. No heparin products.   8.  Code status:  FULL CODE.   Time spent on this case was 60 minutes.    ____________________________ Leia Alf Gaynor Ferreras, MD srs:mr D: 07/26/2012 18:26:25 ET T: 07/26/2012 19:42:48 ET JOB#: [id number]  cc: Alveta Heimlich R. Niveah Boerner, MD, <Dictator> Javier Docker. Ubaldo Glassing, MD Leona Carry Hall Busing, MD   Neita Carp MD ELECTRONICALLY SIGNED 08/07/2012 23:39

## 2014-05-11 NOTE — Op Note (Signed)
PATIENT NAME:  ROCIO, WOLAK MR#:  [id number] DATE OF BIRTH:  Oct 08, 1945  DATE OF PROCEDURE:  07/06/2012  PREOPERATIVE DIAGNOSIS: Atherosclerotic occlusive disease, bilateral lower extremities with rest pain of the right lower extremity.   POSTOPERATIVE DIAGNOSIS: Atherosclerotic occlusive disease, bilateral lower extremities with rest pain of the right lower extremity.   PROCEDURES PERFORMED:  1.  Abdominal aortogram.  2.  Right lower extremity distal runoff third order catheter placement.  3.  Crosser atherectomy.  4.  Percutaneous transluminal angioplasty and stent placement, right SFA.   SURGEON: Levora Dredge, M.D.   SEDATION:  Versed 5 mg plus fentanyl 200 mcg administered IV. Continuous ECG, pulse oximetry and cardiopulmonary monitoring was performed throughout the entire procedure by the interventional radiology nurse. Total sedation time was 1 hour, 50 minutes.   ACCESS: 6-French sheath left common femoral artery.   FLUOROSCOPY TIME: 24.5 minutes.   CONTRAST USED: Isovue 125 mL.   INDICATIONS: Mr. Kabat is a 69 year old gentleman with an extensive history of atherosclerotic occlusive disease and prior interventions. He presents with worsening pain of his of his right lower extremity. Physical examination as well as noninvasive studies demonstrated significant atherosclerotic occlusive disease and he is therefore undergoing angiography with the hope for intervention. The risks and benefits were reviewed. All questions answered. The patient agrees to proceed.    DESCRIPTION OF PROCEDURE: The patient is taken to special procedures and placed in the supine position. After adequate sedation is achieved, both groins are prepped and draped in sterile fashion. Ultrasound is placed in a sterile sleeve. Ultrasound is utilized secondary to lack of appropriate landmarks and to avoid vascular injury. Under direct ultrasound visualization, the common femoral artery is identified. It is  echolucent and homogeneous and pulsatile indicating patency. There is moderate posterior plaque formation noted as well. After evaluating the artery, a portion with a  soft anterior wall is selected. Image is recorded for the permanent record and under direct ultrasound visualization a micropuncture needle is inserted, microwire followed by micro sheath, J-wire followed by a 5-French sheath and 5-French pigtail catheter. Pigtail catheter is positioned at the level of T12 and AP projection of the aorta is obtained. Pigtail catheter is repositioned to above the aortic bifurcation and LAO projection of the pelvis is obtained. Using a VS-1 catheter and a stiff angled Glidewire, the aortic bifurcation was crossed. Catheter is negotiated down into the external iliac initially where magnified oblique views of the external iliac artery are obtained. This demonstrates significant plaque, but it does not appear to be flow limiting. It is diffusely noted along the posterior wall.   The catheter and wire combination are then negotiated into the SFA and distal runoff is obtained. After review of the images, first a dose of 3000 units of heparin was given and subsequently another 2000 units of heparin was given slightly later in the case. A stiff angled Glidewire is reintroduced through the VS-1 catheter and a 6-French Ansel high flex sheath is advanced up and over the bifurcation, positioned with its tip in the mid common femoral.   Wire catheter combination is negotiated into the cul-de-sac of the SFA and subsequently a sidekick catheter and 14-S crosser atherectomy device is positioned. Crosser atherectomy is then performed through the SFA stenosis. Re-entry is then established with the help of the crosser and then using a 4-French Kumpe with the stiff angled Glidewire. With the Kumpe in the mid popliteal at the level of the femoral condyle, hand injection of contrast verifies  intraluminal placement and proper re-entry. It  also then allows for distal runoff, which shows the posterior tibia as the single vessel runoff to the foot, although the anterior tibial and peroneal are patent in their proximal portions. They demonstrate diffuse disease and occlude distally.   A 0.035 wire is then introduced and serial inflations are performed using first a 4 x 20 balloon and subsequently a 5 x 20 balloon. Multiple areas of dissection are noted along the course of the artery and ultimately a total of 4 stents are required beginning distally and working more proximally. After this, the stents are then post dilated with the 5 balloon. Followup angiography beginning at the level of the common femoral and extending down to the distal tibial demonstrates the SFA is now widely patent. No further dissections are present. Residual stenosis has been well treated and the posterior tibial runoff is preserved.   The sheath is pulled into the left external. Oblique view is obtained and a Mynx device is deployed without difficulty. There are no immediate complications.   INTERPRETATION: The aorta is diffusely diseased. 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 It is heavily calcified, but there are no hemodynamically significant stenoses. Bilateral common iliac artery stents are noted and they are patent. There is mild in-stent restenosis slightly more on the right than the left. On the right, thee external iliac artery demonstrates a ribbon-like plaque, which appears to sit posteriorly in multiple views bilateral oblique as well as AP. It is present, but there was never identified of hemodynamically significant focal lesion. The left external iliac has 2 stents, which are patent. The right common femoral demonstrates approximately a 40% stenosis and is diffuse. SFA occludes several centimeters from its origin. Profunda femoris is patent and collateralizes reconstituting an above-knee popliteal. There is patency of the popliteal, although at the level of the  femoral condyles, there is a 50 to 60% smooth tapered segment, short in length. The trifurcation is patent as noted above. Posterior tibial is patent down to the foot and is the dominant vessel. The anterior tibial and posterior tibial are small and appeared to occlude distally.   Following crosser atherectomy, there is proper re-entry. Following angioplasty, there are several areas of dissection as well as several areas of high-grade residual stenosis. Following stent placement, post dilatation to 5 mm, there is resolution with excellent appearance to the SFA. Distal runoff is preserved.   SUMMARY: Successful revascularization with treatment of an occluded superficial femoral artery, which is now patent. ____________________________ Renford DillsGregory G. Schnier, MD ggs:aw D: 07/06/2012 10:57:37 ET T: 07/06/2012 13:59:07 ET JOB#: [id number]  cc: Renford DillsGregory G. Schnier, MD, <Dictator> Jillene Bucksenny C. Arlana Pouchate, MD Renford DillsGREGORY G SCHNIER MD ELECTRONICALLY SIGNED 07/06/2012 16:16

## 2015-02-26 ENCOUNTER — Emergency Department
Admission: EM | Admit: 2015-02-26 | Discharge: 2015-02-26 | Disposition: A | Discharge status: Home / Self Care | Payer: Medicare HMO | Attending: Emergency Medicine | Admitting: Emergency Medicine

## 2015-02-26 ENCOUNTER — Emergency Department: Payer: Medicare HMO

## 2015-02-26 ENCOUNTER — Encounter: Payer: Self-pay | Admitting: Medical Oncology

## 2015-02-26 DIAGNOSIS — Z79899 Other long term (current) drug therapy: Secondary | ICD-10-CM | POA: Diagnosis not present

## 2015-02-26 DIAGNOSIS — Z87891 Personal history of nicotine dependence: Secondary | ICD-10-CM | POA: Insufficient documentation

## 2015-02-26 DIAGNOSIS — L02811 Cutaneous abscess of head [any part, except face]: Secondary | ICD-10-CM | POA: Diagnosis not present

## 2015-02-26 DIAGNOSIS — F17210 Nicotine dependence, cigarettes, uncomplicated: Secondary | ICD-10-CM

## 2015-02-26 DIAGNOSIS — I10 Essential (primary) hypertension: Secondary | ICD-10-CM | POA: Insufficient documentation

## 2015-02-26 DIAGNOSIS — J209 Acute bronchitis, unspecified: Secondary | ICD-10-CM | POA: Insufficient documentation

## 2015-02-26 LAB — GLUCOSE, CAPILLARY: Glucose-Capillary: 99 mg/dL (ref 65–99)

## 2015-02-26 MED ORDER — SULFAMETHOXAZOLE-TRIMETHOPRIM 800-160 MG PO TABS: 1.0000 | ORAL_TABLET | Freq: Two times a day (BID) | ORAL | Status: DC

## 2015-02-26 MED ORDER — OXYCODONE-ACETAMINOPHEN 5-325 MG PO TABS
1.0000 | ORAL_TABLET | Freq: Once | ORAL | Status: AC
Start: 2015-02-26 — End: 2015-02-26
  Administered 2015-02-26: 1 via ORAL
  Filled 2015-02-26: qty 1

## 2015-02-26 MED ORDER — LIDOCAINE HCL (PF) 1 % IJ SOLN
5.0000 mL | Freq: Once | INTRAMUSCULAR | Status: AC
  Administered 2015-02-26: 5 mL
  Filled 2015-02-26: qty 5

## 2015-02-26 MED ORDER — HYDROCODONE-ACETAMINOPHEN 5-325 MG PO TABS: 1.0000 | ORAL_TABLET | ORAL | Status: DC | PRN

## 2015-02-26 NOTE — Discharge Instructions (Signed)
Abscess An abscess (boil or furuncle) is an infected area on or under the skin. This area is filled with yellowish-white fluid (pus) and other material (debris). HOME CARE   Only take medicines as told by your doctor.  If you were given antibiotic medicine, take it as directed. Finish the medicine even if you start to feel better.  If gauze is used, follow your doctor's directions for changing the gauze.  To avoid spreading the infection:  Keep your abscess covered with a bandage.  Wash your hands well.  Do not share personal care items, towels, or whirlpools with others.  Avoid skin contact with others.  Keep your skin and clothes clean around the abscess.  Keep all doctor visits as told. GET HELP RIGHT AWAY IF:   You have more pain, puffiness (swelling), or redness in the wound site.  You have more fluid or blood coming from the wound site.  You have muscle aches, chills, or you feel sick.  You have a fever. MAKE SURE YOU:   Understand these instructions.  Will watch your condition.  Will get help right away if you are not doing well or get worse.   This information is not intended to replace advice given to you by your health care provider. Make sure you discuss any questions you have with your health care provider.   Document Released: 06/24/2007 Document Revised: 07/07/2011 Document Reviewed: 03/21/2011 Elsevier Interactive Patient Education 2016 [employer].   Keep the appointment with the dermatologist. Begin taking antibiotics as directed for 10 days. Norco as needed for pain. Return to the emergency room in 2 days if your drains have not fallen out on their own.

## 2015-02-26 NOTE — ED Provider Notes (Signed)
[facility] Emergency Department Provider Note  ____________________________________________  Time seen: Approximately 2:14 PM  I have reviewed the triage vital signs and the nursing notes.   HISTORY  Chief Complaint Abscess   HPI [name] [name] is a 70 y.o. male is here with complaint of abscess 2  to his head. Patient states he saw his primary care doctor [name] [name] who opened up 1 start him on antibiotics several weeks ago. Patient is unaware of the name of the antibiotic but only took it for 5 days. Patient states that the 2 he has today started after he finished the antibiotic. He denies any fever or chills. He denies any history of diabetes. He states he has an appointment with the dermatologist on 2/16 but because of the pain is unable to wait until then. Patient rates his pain as a 10 out of 10 at present.   Past Medical History  Diagnosis Date  . Hyperlipidemia   . Hypertension   . Coronary artery disease   . Peripheral vascular disease (HCC) 06/26/11    "blockages"  . 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 Lady [name] thinks I've had 2 that I didn't know about"  . Shortness of breath 06/26/11    "all the time for a good while"  . Pneumonia 1950-1960's  . Hypothyroidism     Patient Active Problem List   Diagnosis Date Noted  . CAD (coronary artery disease) 06/26/2011  . Ischemic cardiomyopathy 06/26/2011  . Hyperlipidemia 06/26/2011  . Hypertension 06/26/2011  . Carotid artery disease (HCC) 06/26/2011  . Tobacco abuse 06/26/2011  . Hypothyroidism 06/26/2011    Past Surgical History  Procedure Laterality Date  . Cardiac catheterization  06/24/11  . Tonsillectomy      "as a kid"  . 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  atorvastatin (LIPITOR) 20 MG tablet   Oral   Take 20 mg by mouth at bedtime.         Marland Kitchen EXPIRED: carvedilol (COREG) 3.125 MG tablet   Oral   Take 1 tablet (3.125 mg total) by mouth 2 (two) times daily with a meal.   60 tablet   1   . HYDROcodone-acetaminophen (NORCO/VICODIN) 5-325 MG tablet   Oral   Take 1 tablet by mouth every 4 (four) hours as needed for moderate pain.   20 tablet   0   . levothyroxine (SYNTHROID, LEVOTHROID) 50 MCG tablet   Oral   Take 50 mcg by mouth daily.         Marland Kitchen lisinopril (PRINIVIL,ZESTRIL) 5 MG tablet   Oral   Take 5 mg by mouth daily.         . Rivaroxaban (XARELTO) 20 MG TABS   Oral   Take 20 mg by mouth daily.         Marland Kitchen sulfamethoxazole-trimethoprim (BACTRIM DS,SEPTRA DS) 800-160 MG tablet   Oral   Take 1 tablet by mouth 2 (two) times daily.   20 tablet   0     Allergies Review of patient's allergies indicates no known allergies.  Family History  Problem Relation Age of Onset  . Heart disease Mother 89  . Hypertension Brother   . Arrhythmia Brother  Social History Social History  Substance Use Topics  . Smoking status: Former Smoker -- 1.00 packs/day for 50 years    Types: Cigarettes    Quit date: 06/23/2011  . Smokeless tobacco: None  . Alcohol Use: Yes     Comment: 06/26/11 "every once in awhile I'd drink a beer; that's about come to an end too"    Review of Systems Constitutional: No fever/chills Eyes: No visual changes. Cardiovascular: Denies chest pain. Respiratory: Denies shortness of breath. Nonproductive cough for 2-3 weeks. Gastrointestinal: No nausea, no vomiting.  Musculoskeletal: Negative for back pain. Skin: Positive for abscesses Neurological: Negative for headaches, focal weakness or numbness.  10-point ROS otherwise negative.  ____________________________________________   PHYSICAL EXAM:  VITAL SIGNS: ED Triage Vitals  Enc Vitals Group     BP 02/26/15 1316 134/82 mmHg     Pulse Rate  02/26/15 1316 87     Resp 02/26/15 1316 18     Temp 02/26/15 1316 97.9 F (36.6 C)     Temp src --      SpO2 02/26/15 1316 97 %     Weight 02/26/15 1316 138 lb (62.596 kg)     Height 02/26/15 1316 6' (1.829 m)     Head Cir --      Peak Flow --      Pain Score 02/26/15 1317 10     Pain Loc --      Pain Edu? --      Excl. in GC? --     Constitutional: Alert and oriented. Well appearing and in no acute distress. Eyes: Conjunctivae are normal. PERRL. EOMI. Head: Atraumatic. Nose: No congestion/rhinnorhea. Mouth/Throat: Mucous membranes are moist.  Oropharynx non-erythematous. Neck: No stridor.  Supple. Hematological/Lymphatic/Immunilogical: No cervical lymphadenopathy. Cardiovascular: Normal rate, regular rhythm. Grossly normal heart sounds.  Good peripheral circulation. Respiratory: Normal respiratory effort.  No retractions. Lungs CTAB. Nonproductive cough. No wheezes. Gastrointestinal: Soft and nontender. No distention.  Musculoskeletal: No lower extremity tenderness nor edema.  No joint effusions. Neurologic:  Normal speech and language. No gross focal neurologic deficits are appreciated. No gait instability. Skin:  Skin is warm, dry and intact. Lateral right scalp has 2 individual pustules with erythema. Areas are extremely tender to touch. Psychiatric: Mood and affect are normal. Speech and behavior are normal.  ____________________________________________   LABS (all labs ordered are listed, but only abnormal results are displayed)  Labs Reviewed  GLUCOSE, CAPILLARY  CBG MONITORING, ED    Radiology Chest x-ray per radiologist shows small pleural effusion which could be due to atelectasis or infection.   PROCEDURES  Procedure(s) performed: INCISION AND DRAINAGE Performed by: [name] [name] Consent: Verbal consent obtained. Risks and benefits: risks, benefits and alternatives were discussed Type: abscess  Body area: right side scalp   Anesthesia: local  infiltration   Incision was made with a scalpel.  Local anesthetic: lidocaine 1 % without epinephrine  Anesthetic total: 2 ml  Complexity: complex Blunt dissection to break up loculations  Drainage: purulent  Drainage amount:Minimal   Packing material: 1/4 in iodoform gauze x 2   Patient tolerance: Patient tolerated the procedure well with no immediate complications.    Critical Care performed: No  ____________________________________________   INITIAL IMPRESSION / ASSESSMENT AND PLAN / ED COURSE  Pertinent labs & imaging results that were available during my care of the patient were reviewed by me and considered in my medical decision making (see chart for details).  Patient was placed on Septra DS for his  abscess and for his cough,  Patient is also given prescription for Norco as needed for pain. He is to follow-up with his doctor he was also encouraged to keep his appointment with the dermatologist on 16 th  ____________________________________________   FINAL CLINICAL IMPRESSION(S) / ED DIAGNOSES  Final diagnoses:  Abscess of scalp  Acute bronchitis, unspecified organism  Cigarette smoker      [name] Rumps, PA-C 02/26/15 1636  Minna Antis, MD 02/27/15 2259

## 2015-02-26 NOTE — ED Notes (Signed)
Pt c/o abscess to head.  Pt sts he had 1, was seen by PCP Ladona Ridgel, had it drained and was started one antibiotics.  Pt sts "as soon as I finished the antibiotics, these two cropped up".

## 2015-02-26 NOTE — ED Notes (Signed)
Abscessed area to top of head x 2 weeks.

## 2015-03-22 IMAGING — XA IR VASCULAR PROCEDURE
15 of 24 series · 15 of 24 positions shown · IV contrast (IODINE)
Comparison: none

[Series 2: care aorta · 1 of 2 slices shown]
[im 1/2]
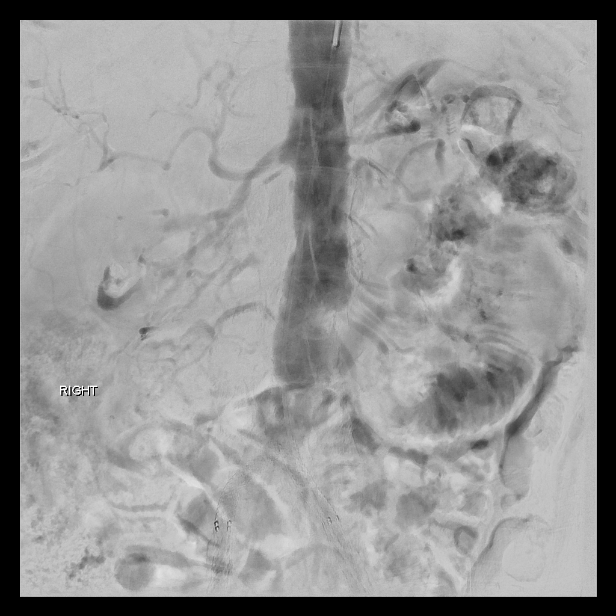

[Series 4: care sfa · 1 of 2 slices shown (1 of 12)]
[im 1/2]
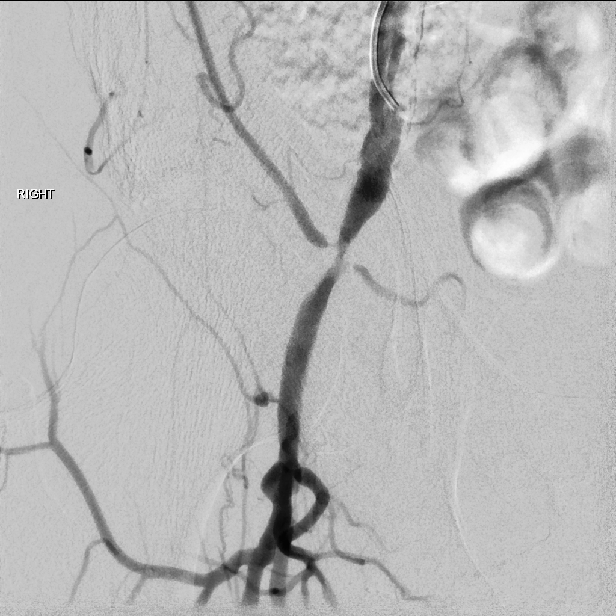

[Series 6: care sfa · 1 of 2 slices shown (2 of 12)]
[im 1/2]
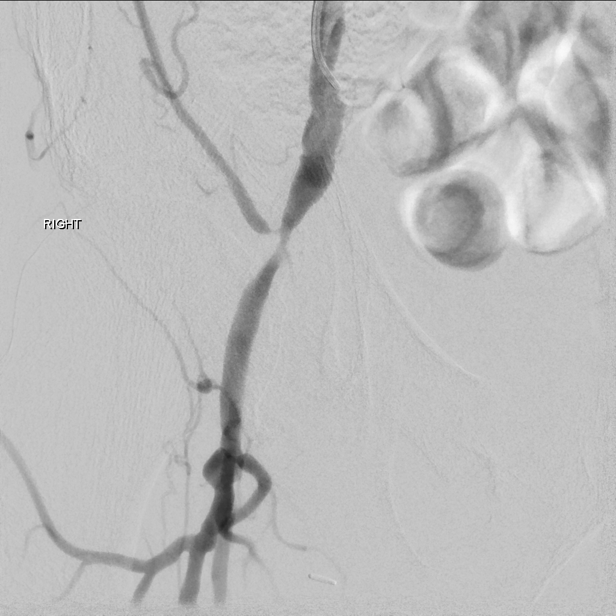

[Series 7: care sfa · 1 of 2 slices shown (3 of 12)]
[im 1/2]
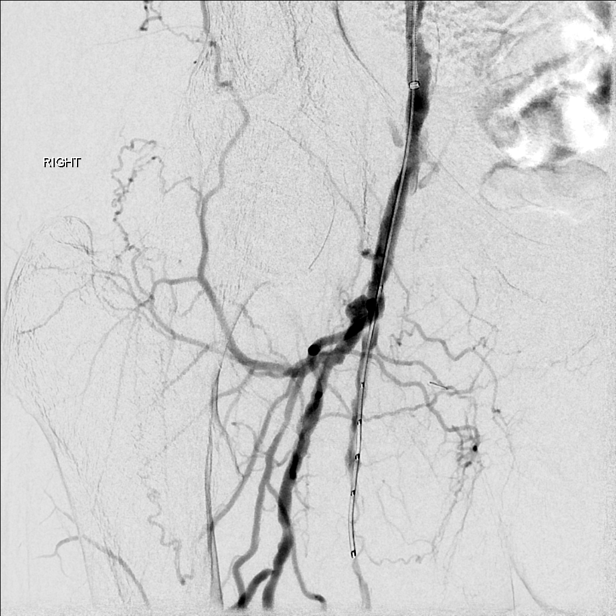

[Series 9: care sfa · 1 of 2 slices shown (4 of 12)]
[im 1/2]
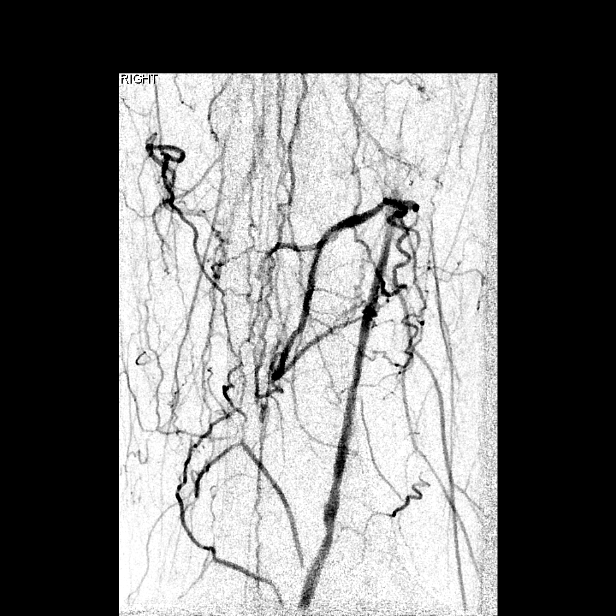

[Series 10: fl - angio · 1 of 2 slices shown (1 of 2)]
[im 1/2]
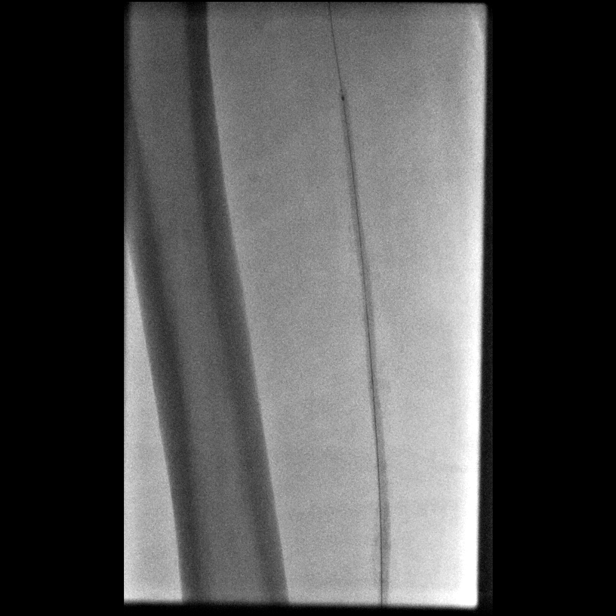

[Series 12: care sfa · 1 of 2 slices shown (5 of 12)]
[im 1/2]
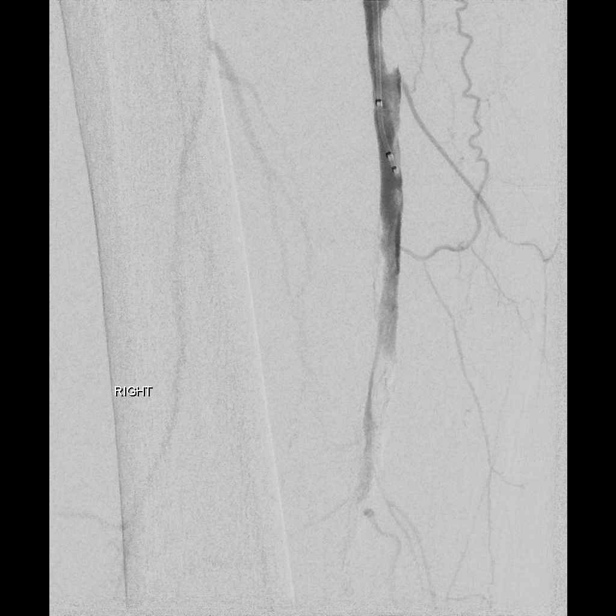

[Series 14: care sfa · 1 of 3 slices shown (6 of 12)]
[im 1/3]
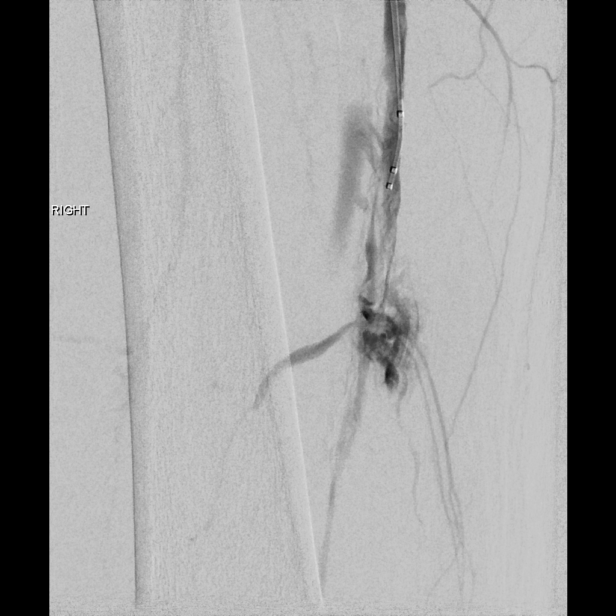

[Series 15: care sfa · 1 of 2 slices shown (7 of 12)]
[im 1/2]
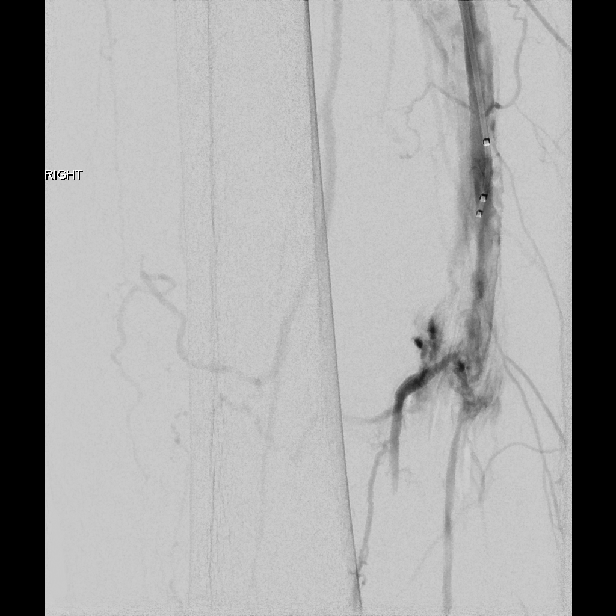

[Series 17: care sfa · 1 of 2 slices shown (8 of 12)]
[im 1/2]
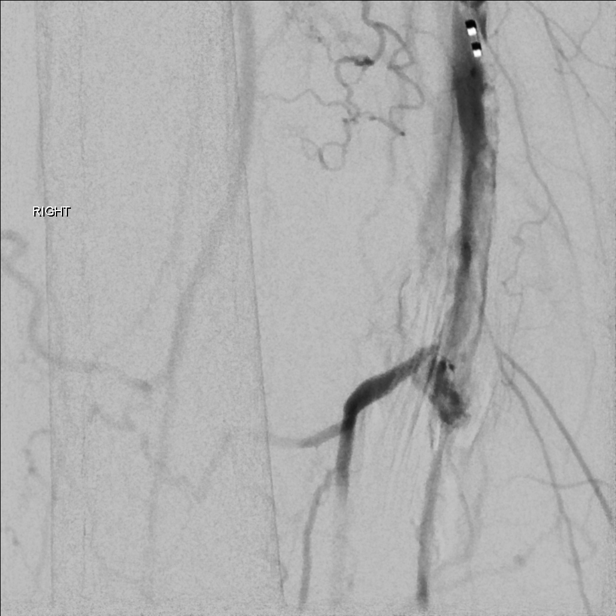

[Series 18: care sfa · 1 of 2 slices shown (9 of 12)]
[im 1/2]
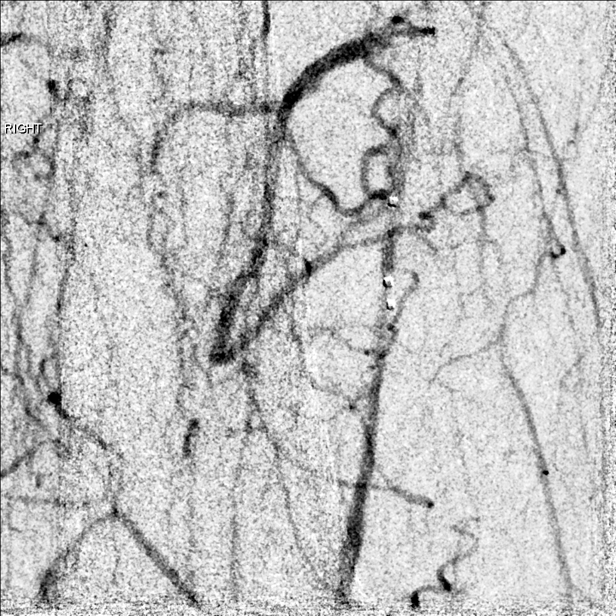

[Series 20: care sfa · 1 of 2 slices shown (10 of 12)]
[im 1/2]
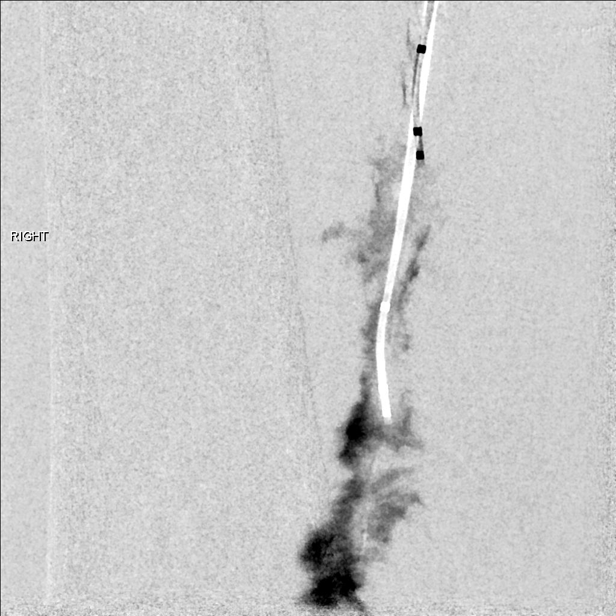

[Series 22: fl - angio · 1 of 1 slices shown (2 of 2)]
[im 1/1]
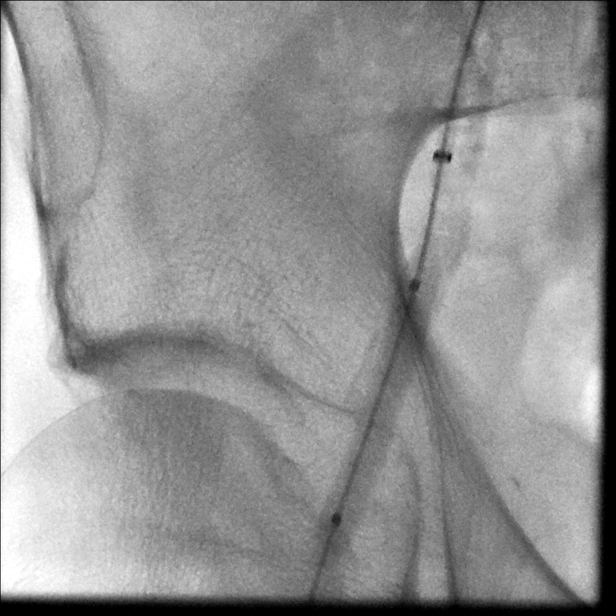

[Series 23: care sfa · 1 of 2 slices shown (11 of 12)]
[im 1/2]
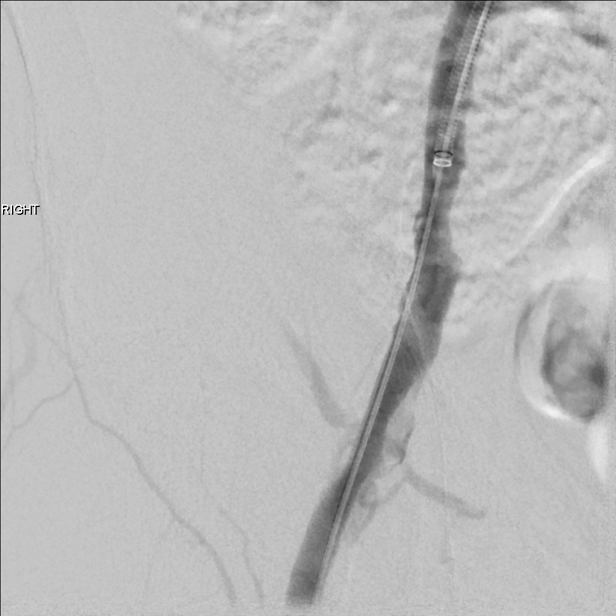

[Series 25: care sfa · 1 of 2 slices shown (12 of 12)]
[im 1/2]
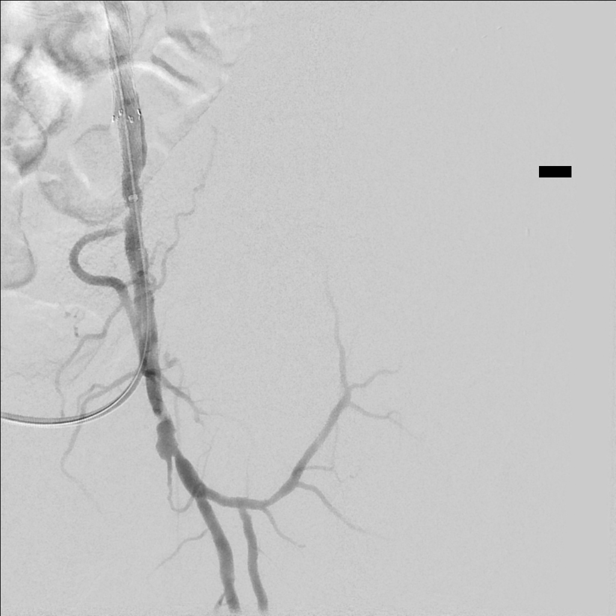

[15 of 24 positions shown; findings below may reference images not displayed]

IMAGES IMPORTED FROM THE [employer] WORKFLOW SYSTEM
NO DICTATION FOR STUDY

## 2017-02-23 ENCOUNTER — Ambulatory Visit (INDEPENDENT_AMBULATORY_CARE_PROVIDER_SITE_OTHER): Payer: Medicare HMO

## 2017-02-23 ENCOUNTER — Ambulatory Visit (INDEPENDENT_AMBULATORY_CARE_PROVIDER_SITE_OTHER): Payer: Medicare HMO | Admitting: Vascular Surgery

## 2017-02-23 ENCOUNTER — Encounter (INDEPENDENT_AMBULATORY_CARE_PROVIDER_SITE_OTHER): Payer: Self-pay | Admitting: Vascular Surgery

## 2017-02-23 ENCOUNTER — Other Ambulatory Visit (INDEPENDENT_AMBULATORY_CARE_PROVIDER_SITE_OTHER): Payer: Self-pay | Admitting: Vascular Surgery

## 2017-02-23 VITALS — HR 78 | Resp 17 | Ht 72.0 in | Wt 180.0 lb

## 2017-02-23 DIAGNOSIS — I739 Peripheral vascular disease, unspecified: Secondary | ICD-10-CM | POA: Diagnosis not present

## 2017-02-23 DIAGNOSIS — M7989 Other specified soft tissue disorders: Secondary | ICD-10-CM

## 2017-02-23 DIAGNOSIS — I779 Disorder of arteries and arterioles, unspecified: Secondary | ICD-10-CM

## 2017-02-23 DIAGNOSIS — Z720 Tobacco use: Secondary | ICD-10-CM | POA: Diagnosis not present

## 2017-02-23 DIAGNOSIS — R600 Localized edema: Secondary | ICD-10-CM | POA: Diagnosis not present

## 2017-02-23 DIAGNOSIS — I159 Secondary hypertension, unspecified: Secondary | ICD-10-CM

## 2017-02-23 DIAGNOSIS — E785 Hyperlipidemia, unspecified: Secondary | ICD-10-CM

## 2017-02-23 MED ORDER — CEPHALEXIN 500 MG PO CAPS: 500.0000 mg | ORAL_CAPSULE | Freq: Four times a day (QID) | ORAL | 0 refills | Status: AC

## 2017-02-23 NOTE — Progress Notes (Signed)
Subjective:    Patient ID: [name] [name], male    DOB: 06-11-1945, 72 y.o.   MRN: [id number] Chief Complaint  Patient presents with  . New Patient (Initial Visit)    Leg swelling   Patient last seen in 2014 for evaluation of carotid artery disease, peripheral artery disease and lower extremity edema.  Since his last visit, the patient has not undergone any recent imaging to assess his atherosclerotic disease.  The patient presents today with a month of progressively worsening bilateral lower extremity edema.  The patient notes the edema is associated with discomfort.  The edema worsens towards the end of the day or with standing /sitting for long periods of time.  The patient notes pain with ambulation and at rest.  The patient has not been engaging in conservative therapy including wearing medical grade one compression stockings or elevating his legs on a daily basis as he states his legs are too "swollen".  The patient denies any ulceration to the bilateral lower extremity.  The patient continues to use tobacco on a daily basis.  The patient denies any recent trauma/surgery or medication change that may have provoked this one month of worsening edema to the bilateral lower extremity.  The patient notes a redness to his toes however a blueness to the tips of his toes.  The patient denies any fever, nausea vomiting.  Patient denies any erythema to the rest of the extremity.   Review of Systems  Constitutional: Negative.   HENT: Negative.   Eyes: Negative.   Respiratory: Negative.   Cardiovascular: Positive for leg swelling.       Claudication Carotid artery disease  Gastrointestinal: Negative.   Endocrine: Negative.   Genitourinary: Negative.   Musculoskeletal: Negative.   Skin: Negative.   Allergic/Immunologic: Negative.   Neurological: Negative.   Hematological: Negative.   Psychiatric/Behavioral: Negative.       Objective:   Physical Exam  Constitutional: He is oriented to  person, place, and time. He appears well-developed and well-nourished. No distress.  HENT:  Head: Normocephalic and atraumatic.  Eyes: Conjunctivae are normal. Pupils are equal, round, and reactive to light.  Neck: Normal range of motion.  Bilateral bruit noted on exam  Cardiovascular: Normal rate, regular rhythm, normal heart sounds and intact distal pulses.  Pulses:      Radial pulses are 2+ on the right side, and 2+ on the left side.  Unable to palpate pedal pulses due to edema.  The patient's forefoot is red and the tips of his toes are blue.  The tips of his toes are cold bilaterally.  Skin is intact.  Pulmonary/Chest: Effort normal and breath sounds normal.  Musculoskeletal: Normal range of motion. He exhibits edema (Moderate 1+ pitting edema noted bilaterally).  Neurological: He is alert and oriented to person, place, and time.  Skin: He is not diaphoretic.  Psychiatric: He has a normal mood and affect. His behavior is normal. Judgment and thought content normal.  Vitals reviewed.  Pulse 78   Resp 17   Ht 6' (1.829 m)   Wt 180 lb (81.6 kg)   BMI 24.41 kg/m   Past Medical History:  Diagnosis Date  . Coronary artery disease   . Hyperlipidemia   . Hypertension   . Hypothyroidism   . 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 Lady Gary thinks I've had 2 that I didn't know about"  . Peripheral vascular disease (HCC) 06/26/11   "blockages"  . Pneumonia 1950-1960's  .  Shortness of breath 06/26/11   "all the time for a good while"   Social History   Socioeconomic History  . Marital status: Married    Spouse name: Not on file  . Number of children: 2  . Years of education: Not on file  . Highest education level: Not on file  Social Needs  . Financial resource strain: Not on file  . Food insecurity - worry: Not on file  . Food insecurity - inability: Not on file  . Transportation needs - medical: Not on file  . Transportation needs - non-medical: Not on file  Occupational  History  . Not on file  Tobacco Use  . Smoking status: Former Smoker    Packs/day: 1.00    Years: 50.00    Pack years: 50.00    Types: Cigarettes    Last attempt to quit: 06/23/2011    Years since quitting: 5.6  . Smokeless tobacco: Never Used  Substance and Sexual Activity  . Alcohol use: Yes    Comment: 06/26/11 "every once in awhile I'd drink a beer; that's about come to an end too"  . Drug use: No  . Sexual activity: Not Currently  Other Topics Concern  . Not on file  Social History Narrative   Lives in [place], [place] alone.    Past Surgical History:  Procedure Laterality Date  . CARDIAC CATHETERIZATION  06/24/11  . CORONARY ARTERY BYPASS GRAFT  06/30/2011   Procedure: CORONARY ARTERY BYPASS GRAFTING (CABG);  Surgeon: Loreli Slot, MD;  Location[facility] OR;  Service: Open Heart Surgery;  Laterality: N/A;  . TONSILLECTOMY     "as a kid"   Family History  Problem Relation Age of Onset  . Heart disease Mother 64  . Hypertension Brother   . Arrhythmia Brother    No Known Allergies     Assessment & Plan:  Patient last seen in 2014 for evaluation of carotid artery disease, peripheral artery disease and lower extremity edema.  Since his last visit, the patient has not undergone any recent imaging to assess his atherosclerotic disease.  The patient presents today with a month of progressively worsening bilateral lower extremity edema.  The patient notes the edema is associated with discomfort.  The edema worsens towards the end of the day or with standing /sitting for long periods of time.  The patient notes pain with ambulation and at rest.  The patient has not been engaging in conservative therapy including wearing medical grade one compression stockings or elevating his legs on a daily basis as he states his legs are too "swollen".  The patient denies any ulceration to the bilateral lower extremity.  The patient continues to use tobacco on a daily basis.  The patient denies any recent  trauma/surgery or medication change that may have provoked this one month of worsening edema to the bilateral lower extremity.  The patient notes a redness to his toes however a blueness to the tips of his toes.  The patient denies any fever, nausea vomiting.  Patient denies any erythema to the rest of the extremity.  1. Tobacco abuse - Stable We had a discussion for approximately 10 minutes regarding the absolute need for smoking cessation due to the deleterious nature of tobacco on the vascular system. We discussed the tobacco use would diminish patency of any intervention, and likely significantly worsen progressio of disease. We discussed multiple agents for quitting including replacement therapy or medications to reduce cravings such as Chantix. The patient  voices their understanding of the importance of smoking cessation.  2. Hyperlipidemia, unspecified hyperlipidemia type - Stable Encouraged good control as its slows the progression of atherosclerotic disease  3. Bilateral carotid artery disease, unspecified type (HCC) - Stable This has not been checked in over 4 years. I will bring the patient back as soon as possible to undergo bilateral carotid duplex to assess any progression of his atherosclerotic disease At this time, he is currently asymptomatic however there are bilateral bruits noted on exam  4. Secondary hypertension - Stable Encouraged good control as its slows the progression of atherosclerotic disease  5. Bilateral lower extremity edema - Stable A stat ABI was notable for right ABI 0.67 and left ABI 0.66 so bilateral Unna wraps are not contraindicated. I will attempt to control the patient's edema with zinc oxide 3 layer bilateral Unna wraps changed weekly for approximately 1 month I am hoping to gain control of the patient's edema and then transition him into compression stockings I will bring the patient back in approximately 1 month and have him undergo bilateral venous  ultrasound to rule out any contributing reflux disease. The patient was strongly encouraged to elevate his legs heart level or higher Keflex 500 mg 1 tab every 6 hours x 10 days for cellulitis to the bilateral feet  6. PAD (peripheral artery disease) (HCC) - Stable Patient with multiple risk factors for peripheral artery disease Hard to palpate pedal pulses on exam Colder toes bilaterally Patient was found to have moderate bilateral lower extremity arterial occlusive disease. Right 0.67 and left 0.66 At this time, we will focus on controlling the patient's edema however he may need to undergo endovenous intervention in the future  - VAS US ABI WITH/WO TBI; Future  Current Outpatient Medications on File Prior to Visit  Medication Sig Dispense Refill  . aspirin EC 81 MG tablet Take 81 mg by mouth daily.    Marland Kitchen. atorvastatin (LIPITOR) 20 MG tablet Take 20 mg by mouth at bedtime.    . furosemide (LASIX) 40 MG tablet Take 40 mg by mouth every morning.  3  . levothyroxine (SYNTHROID, LEVOTHROID) 100 MCG tablet TAKE 1 TABLET BY MOUTH DAILY AND TAKE 1 AND 1/2 TABLETS ON WED AND SAT  5  . lisinopril (PRINIVIL,ZESTRIL) 10 MG tablet Take 10 mg by mouth daily.  5  . amoxicillin (AMOXIL) 500 MG capsule     . carvedilol (COREG) 3.125 MG tablet Take 1 tablet (3.125 mg total) by mouth 2 (two) times daily with a meal. 60 tablet 1  . HYDROcodone-acetaminophen (NORCO/VICODIN) 5-325 MG tablet Take 1 tablet by mouth every 4 (four) hours as needed for moderate pain. (Patient not taking: Reported on 02/23/2017) 20 tablet 0  . Rivaroxaban (XARELTO) 20 MG TABS Take 20 mg by mouth daily.    Marland Kitchen. sulfamethoxazole-trimethoprim (BACTRIM DS,SEPTRA DS) 800-160 MG tablet Take 1 tablet by mouth 2 (two) times daily. (Patient not taking: Reported on 02/23/2017) 20 tablet 0   No current facility-administered medications on file prior to visit.    There are no Patient Instructions on file for this visit. No Follow-up on  file.  Jaylea Plourde A Zaion Hreha, PA-C

## 2017-03-02 ENCOUNTER — Ambulatory Visit (INDEPENDENT_AMBULATORY_CARE_PROVIDER_SITE_OTHER): Payer: Medicare HMO | Admitting: Vascular Surgery

## 2017-03-02 ENCOUNTER — Encounter (INDEPENDENT_AMBULATORY_CARE_PROVIDER_SITE_OTHER): Payer: Self-pay

## 2017-03-02 VITALS — BP 110/70 | HR 72 | Resp 16 | Wt 178.8 lb

## 2017-03-02 DIAGNOSIS — R600 Localized edema: Secondary | ICD-10-CM

## 2017-03-02 DIAGNOSIS — M7989 Other specified soft tissue disorders: Secondary | ICD-10-CM

## 2017-03-02 NOTE — Progress Notes (Signed)
History of Present Illness  There is no documented history at this time  Assessments & Plan   There are no diagnoses linked to this encounter.    Additional instructions  Subjective:  Patient presents with venous ulcer of the Bilateral lower extremity.    Procedure:  3 layer unna wrap was placed Bilateral lower extremity.   Plan:   Follow up in one week.  

## 2017-03-07 ENCOUNTER — Emergency Department: Payer: Medicare HMO

## 2017-03-07 ENCOUNTER — Inpatient Hospital Stay
Admission: EM | Admit: 2017-03-07 | Discharge: 2017-03-11 | DRG: 291 | Disposition: A | Discharge status: Home / Self Care | Payer: Medicare HMO | Attending: Internal Medicine | Admitting: Internal Medicine

## 2017-03-07 ENCOUNTER — Encounter: Payer: Self-pay | Admitting: Emergency Medicine

## 2017-03-07 DIAGNOSIS — E785 Hyperlipidemia, unspecified: Secondary | ICD-10-CM | POA: Diagnosis present

## 2017-03-07 DIAGNOSIS — L270 Generalized skin eruption due to drugs and medicaments taken internally: Secondary | ICD-10-CM | POA: Diagnosis present

## 2017-03-07 DIAGNOSIS — J9601 Acute respiratory failure with hypoxia: Secondary | ICD-10-CM | POA: Diagnosis present

## 2017-03-07 DIAGNOSIS — I130 Hypertensive heart and chronic kidney disease with heart failure and stage 1 through stage 4 chronic kidney disease, or unspecified chronic kidney disease: Principal | ICD-10-CM | POA: Diagnosis present

## 2017-03-07 DIAGNOSIS — K8020 Calculus of gallbladder without cholecystitis without obstruction: Secondary | ICD-10-CM | POA: Diagnosis present

## 2017-03-07 DIAGNOSIS — Z87891 Personal history of nicotine dependence: Secondary | ICD-10-CM | POA: Diagnosis not present

## 2017-03-07 DIAGNOSIS — M6281 Muscle weakness (generalized): Secondary | ICD-10-CM | POA: Diagnosis present

## 2017-03-07 DIAGNOSIS — N320 Bladder-neck obstruction: Secondary | ICD-10-CM | POA: Diagnosis present

## 2017-03-07 DIAGNOSIS — N401 Enlarged prostate with lower urinary tract symptoms: Secondary | ICD-10-CM | POA: Diagnosis present

## 2017-03-07 DIAGNOSIS — Z7982 Long term (current) use of aspirin: Secondary | ICD-10-CM | POA: Diagnosis not present

## 2017-03-07 DIAGNOSIS — I248 Other forms of acute ischemic heart disease: Secondary | ICD-10-CM | POA: Diagnosis present

## 2017-03-07 DIAGNOSIS — N183 Chronic kidney disease, stage 3 (moderate): Secondary | ICD-10-CM | POA: Diagnosis present

## 2017-03-07 DIAGNOSIS — Z79899 Other long term (current) drug therapy: Secondary | ICD-10-CM | POA: Diagnosis not present

## 2017-03-07 DIAGNOSIS — I2510 Atherosclerotic heart disease of native coronary artery without angina pectoris: Secondary | ICD-10-CM | POA: Diagnosis present

## 2017-03-07 DIAGNOSIS — N179 Acute kidney failure, unspecified: Secondary | ICD-10-CM | POA: Diagnosis not present

## 2017-03-07 DIAGNOSIS — E039 Hypothyroidism, unspecified: Secondary | ICD-10-CM | POA: Diagnosis present

## 2017-03-07 DIAGNOSIS — I252 Old myocardial infarction: Secondary | ICD-10-CM

## 2017-03-07 DIAGNOSIS — K7460 Unspecified cirrhosis of liver: Secondary | ICD-10-CM | POA: Diagnosis present

## 2017-03-07 DIAGNOSIS — R188 Other ascites: Secondary | ICD-10-CM | POA: Diagnosis present

## 2017-03-07 DIAGNOSIS — Z8249 Family history of ischemic heart disease and other diseases of the circulatory system: Secondary | ICD-10-CM

## 2017-03-07 DIAGNOSIS — Z951 Presence of aortocoronary bypass graft: Secondary | ICD-10-CM | POA: Diagnosis not present

## 2017-03-07 DIAGNOSIS — Z79890 Hormone replacement therapy (postmenopausal): Secondary | ICD-10-CM

## 2017-03-07 DIAGNOSIS — R2681 Unsteadiness on feet: Secondary | ICD-10-CM | POA: Diagnosis present

## 2017-03-07 DIAGNOSIS — Z7901 Long term (current) use of anticoagulants: Secondary | ICD-10-CM | POA: Diagnosis not present

## 2017-03-07 DIAGNOSIS — I509 Heart failure, unspecified: Secondary | ICD-10-CM

## 2017-03-07 DIAGNOSIS — I5023 Acute on chronic systolic (congestive) heart failure: Secondary | ICD-10-CM | POA: Diagnosis present

## 2017-03-07 DIAGNOSIS — N5089 Other specified disorders of the male genital organs: Secondary | ICD-10-CM | POA: Diagnosis present

## 2017-03-07 DIAGNOSIS — N138 Other obstructive and reflux uropathy: Secondary | ICD-10-CM | POA: Diagnosis present

## 2017-03-07 LAB — CBC WITH DIFFERENTIAL/PLATELET
BASOS ABS: 0.1 10*3/uL (ref 0–0.1)
BASOS PCT: 1 %
Eosinophils Absolute: 0.1 10*3/uL (ref 0–0.7)
Eosinophils Relative: 2 %
HCT: 37.4 % — ABNORMAL LOW (ref 40.0–52.0)
HEMOGLOBIN: 12.3 g/dL — AB (ref 13.0–18.0)
Lymphocytes Relative: 7 %
Lymphs Abs: 0.4 10*3/uL — ABNORMAL LOW (ref 1.0–3.6)
MCH: 27 pg (ref 26.0–34.0)
MCHC: 32.8 g/dL (ref 32.0–36.0)
MCV: 82.1 fL (ref 80.0–100.0)
MONO ABS: 0.8 10*3/uL (ref 0.2–1.0)
Monocytes Relative: 12 %
NEUTROS ABS: 4.9 10*3/uL (ref 1.4–6.5)
NEUTROS PCT: 78 %
Platelets: 244 10*3/uL (ref 150–440)
RBC: 4.56 MIL/uL (ref 4.40–5.90)
RDW: 19.8 % — AB (ref 11.5–14.5)
WBC: 6.3 10*3/uL (ref 3.8–10.6)

## 2017-03-07 LAB — COMPREHENSIVE METABOLIC PANEL
ALK PHOS: 204 U/L — AB (ref 38–126)
ALT: 8 U/L — ABNORMAL LOW (ref 17–63)
ANION GAP: 9 (ref 5–15)
AST: 24 U/L (ref 15–41)
Albumin: 3.2 g/dL — ABNORMAL LOW (ref 3.5–5.0)
BILIRUBIN TOTAL: 1.6 mg/dL — AB (ref 0.3–1.2)
BUN: 22 mg/dL — ABNORMAL HIGH (ref 6–20)
CALCIUM: 8.6 mg/dL — AB (ref 8.9–10.3)
CO2: 24 mmol/L (ref 22–32)
Chloride: 102 mmol/L (ref 101–111)
Creatinine, Ser: 1.61 mg/dL — ABNORMAL HIGH (ref 0.61–1.24)
GFR, EST AFRICAN AMERICAN: 48 mL/min — AB (ref >60)
GFR, EST NON AFRICAN AMERICAN: 41 mL/min — AB (ref >60)
Glucose, Bld: 107 mg/dL — ABNORMAL HIGH (ref 65–99)
POTASSIUM: 3.8 mmol/L (ref 3.5–5.1)
Sodium: 135 mmol/L (ref 135–145)
TOTAL PROTEIN: 7.2 g/dL (ref 6.5–8.1)

## 2017-03-07 LAB — URINALYSIS, COMPLETE (UACMP) WITH MICROSCOPIC
BILIRUBIN URINE: NEGATIVE
Bacteria, UA: NONE SEEN
GLUCOSE, UA: NEGATIVE mg/dL
Ketones, ur: NEGATIVE mg/dL
Leukocytes, UA: NEGATIVE
NITRITE: NEGATIVE
PH: 5 (ref 5.0–8.0)
Protein, ur: NEGATIVE mg/dL
SPECIFIC GRAVITY, URINE: 1.014 (ref 1.005–1.030)
Squamous Epithelial / LPF: NONE SEEN

## 2017-03-07 LAB — TROPONIN I: Troponin I: 0.04 ng/mL (ref <0.03)

## 2017-03-07 LAB — PROTIME-INR
INR: 1.26
Prothrombin Time: 15.7 seconds — ABNORMAL HIGH (ref 11.4–15.2)

## 2017-03-07 LAB — BRAIN NATRIURETIC PEPTIDE

## 2017-03-07 MED ORDER — FUROSEMIDE 10 MG/ML IJ SOLN
80.0000 mg | Freq: Once | INTRAMUSCULAR | Status: AC
  Administered 2017-03-07: 80 mg via INTRAVENOUS
  Filled 2017-03-07: qty 8

## 2017-03-07 NOTE — H&P (Signed)
[facility] - [place] at [facility]   PATIENT NAME: [name] [name]    MR#:  [id number]  DATE OF BIRTH:  19-Apr-1945  DATE OF ADMISSION:  03/07/2017  PRIMARY CARE PHYSICIAN: Jaclyn Shaggy, MD   REQUESTING/REFERRING PHYSICIAN:   CHIEF COMPLAINT:   Chief Complaint  Patient presents with  . Groin Swelling  . Abdominal Pain    HISTORY OF PRESENT ILLNESS: [name] [name]  is a 72 y.o. male with a known history of coronary artery disease, status post old MI, status post CABG in the past, hyperlipidemia, hypertension, peripheral vascular disease and venous insufficiency. Patient presented to emergency room this time for worsening swelling in the lower extremities abdomen and scrotal area, associated with shortness of breath at rest and with exertion.  The symptoms have been going on for the past 2-4 weeks, gradually getting worse.  Patient admits to spending most of the time in a recliner, due to generalized weakness.  He denies any chest pain, no fever/chills, no nausea, vomiting, diarrhea, no bleeding.  He states he is compliant with his medications. Upon evaluation in the emergency room, he is noted to be in anasarca.  Oxygen saturation at the arrival to emergency room was low at 90%. Chest x-ray shows cardiomegaly with vascular congestion and small bilateral pleural effusions.  Blood test results are remarkable for elevated BNP>4,500.  Troponin is elevated at 0.04, which seems to be baseline for this patient.  Creatinine is 1.61 and BUN 22.  His shortness of breath improves after 80 mg IV Lasix. Patient is admitted for further evaluation and management.  PAST MEDICAL HISTORY:   Past Medical History:  Diagnosis Date  . Coronary artery disease   . Hyperlipidemia   . Hypertension   . Hypothyroidism   . 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 Lady Gary thinks I've had 2 that I didn't know about"  . Peripheral vascular disease (HCC) 06/26/11   "blockages"  . Pneumonia  1950-1960's  . Shortness of breath 06/26/11   "all the time for a good while"    PAST SURGICAL HISTORY:  Past Surgical History:  Procedure Laterality Date  . CARDIAC CATHETERIZATION  06/24/11  . CORONARY ARTERY BYPASS GRAFT  06/30/2011   Procedure: CORONARY ARTERY BYPASS GRAFTING (CABG);  Surgeon: Loreli Slot, MD;  Location: [facility] OR;  Service: Open Heart Surgery;  Laterality: N/A;  . TONSILLECTOMY     "as a kid"    SOCIAL HISTORY:  Social History   Tobacco Use  . Smoking status: Former Smoker    Packs/day: 1.00    Years: 50.00    Pack years: 50.00    Types: Cigarettes    Last attempt to quit: 06/23/2011    Years since quitting: 5.7  . Smokeless tobacco: Never Used  Substance Use Topics  . Alcohol use: Yes    Comment: 06/26/11 "every once in awhile I'd drink a beer; that's about come to an end too"    FAMILY HISTORY:  Family History  Problem Relation Age of Onset  . Heart disease Mother 66  . Hypertension Brother   . Arrhythmia Brother     DRUG ALLERGIES: No Known Allergies  REVIEW OF SYSTEMS:   CONSTITUTIONAL: No fever, the patient complains of fatigue generalized weakness.  EYES: No vision injuries.  EARS, NOSE, AND THROAT: No tinnitus or ear pain.  RESPIRATORY: Positive for shortness of breath; no wheezing or hemoptysis.  CARDIOVASCULAR: No chest pain, but patient complains of orthopnea and edema.  GASTROINTESTINAL: No nausea, vomiting, diarrhea or abdominal pain.  GENITOURINARY: No dysuria, hematuria.  ENDOCRINE: No polyuria, nocturia,  HEMATOLOGY: No bleeding SKIN: No rash or lesion. MUSCULOSKELETAL: Patient complains of generalized weakness.   NEUROLOGIC: No focal weakness.  PSYCHIATRY: No anxiety or depression.   MEDICATIONS AT HOME:  Prior to Admission medications   Medication Sig Start Date End Date Taking? Authorizing Provider  aspirin EC 81 MG tablet Take 81 mg by mouth daily.   Yes [provider]  atorvastatin (LIPITOR) 20 MG tablet  Take 20 mg by mouth at bedtime.   Yes [provider]  carvedilol (COREG) 3.125 MG tablet Take 1 tablet (3.125 mg total) by mouth 2 (two) times daily with a meal. 07/09/11 03/07/17 Yes Doree Fudge M, PA-C  furosemide (LASIX) 40 MG tablet Take 40 mg by mouth every morning. 12/29/16  Yes [provider]  levothyroxine (SYNTHROID, LEVOTHROID) 100 MCG tablet TAKE 1 TABLET BY MOUTH DAILY AND TAKE 1 AND 1/2 TABLETS ON WED AND SAT 12/06/16  Yes [provider]  lisinopril (PRINIVIL,ZESTRIL) 10 MG tablet Take 10 mg by mouth daily. 12/14/16  Yes [provider]  Rivaroxaban (XARELTO) 20 MG TABS Take 20 mg by mouth daily.   Yes [provider]  amoxicillin (AMOXIL) 500 MG capsule  02/02/17   [provider]  HYDROcodone-acetaminophen (NORCO/VICODIN) 5-325 MG tablet Take 1 tablet by mouth every 4 (four) hours as needed for moderate pain. Patient not taking: Reported on 02/23/2017 02/26/15   Tommi Rumps, PA-C  sulfamethoxazole-trimethoprim (BACTRIM DS,SEPTRA DS) 800-160 MG tablet Take 1 tablet by mouth 2 (two) times daily. Patient not taking: Reported on 02/23/2017 02/26/15   Tommi Rumps, PA-C      PHYSICAL EXAMINATION:   VITAL SIGNS: Blood pressure 119/74, pulse 63, temperature 97.8 F (36.6 C), temperature source Oral, resp. rate 18, height 6' (1.829 m), weight 80.7 kg (178 lb), SpO2 94 %.  GENERAL:  72 y.o.-year-old patient lying in the bed, in mild to moderate distress, secondary to generalized edema.  EYES: Pupils equal, round, reactive to light and accommodation. No scleral icterus. Extraocular muscles intact.  HEENT: Head atraumatic, normocephalic. Oropharynx and nasopharynx clear.  NECK:   Bilateral jugular venous distention is present. No thyroid enlargement, no tenderness.  LUNGS: Reduced breath sounds bilaterally, no wheezing, but bibasilar crackles are heard. No use of accessory muscles of respiration.  CARDIOVASCULAR: S1, S2  normal.  S3 and S4 are noted.  ABDOMEN: Is nontender, but very distended. Bowel sounds present.   EXTREMITIES: 3+ bilateral lower extremities edema, up to the hips is noted. NEUROLOGIC: No focal weakness; gait not checked, as patient is too weak to ambulate. PSYCHIATRIC: The patient is alert and oriented x 3.  SKIN: No obvious rash, lesion, or ulcer.   LABORATORY PANEL:   CBC Recent Labs  Lab 03/07/17 2002  WBC 6.3  HGB 12.3*  HCT 37.4*  PLT 244  MCV 82.1  MCH 27.0  MCHC 32.8  RDW 19.8*  LYMPHSABS 0.4*  MONOABS 0.8  EOSABS 0.1  BASOSABS 0.1   ------------------------------------------------------------------------------------------------------------------  Chemistries  Recent Labs  Lab 03/07/17 2002  NA 135  K 3.8  CL 102  CO2 24  GLUCOSE 107*  BUN 22*  CREATININE 1.61*  CALCIUM 8.6*  AST 24  ALT 8*  ALKPHOS 204*  BILITOT 1.6*   ------------------------------------------------------------------------------------------------------------------ estimated creatinine clearance is 46.2 mL/min (A) (by C-G formula based on SCr of 1.61 mg/dL (H)). ------------------------------------------------------------------------------------------------------------------ No results for input(s): TSH,  T4TOTAL, T3FREE, THYROIDAB in the last 72 hours.  Invalid input(s): FREET3   Coagulation profile Recent Labs  Lab 03/07/17 2106  INR 1.26   ------------------------------------------------------------------------------------------------------------------- No results for input(s): DDIMER in the last 72 hours. -------------------------------------------------------------------------------------------------------------------  Cardiac Enzymes Recent Labs  Lab 03/07/17 2002  TROPONINI 0.04*   ------------------------------------------------------------------------------------------------------------------ Invalid input(s):  POCBNP  ---------------------------------------------------------------------------------------------------------------  Urinalysis    Component Value Date/Time   COLORURINE YELLOW (A) 03/07/2017 2106   APPEARANCEUR CLEAR (A) 03/07/2017 2106   APPEARANCEUR Hazy 08/01/2012 1657   LABSPEC 1.014 03/07/2017 2106   LABSPEC 1.010 08/01/2012 1657   PHURINE 5.0 03/07/2017 2106   GLUCOSEU NEGATIVE 03/07/2017 2106   GLUCOSEU Negative 08/01/2012 1657   HGBUR SMALL (A) 03/07/2017 2106   BILIRUBINUR NEGATIVE 03/07/2017 2106   BILIRUBINUR Negative 08/01/2012 1657   KETONESUR NEGATIVE 03/07/2017 2106   PROTEINUR NEGATIVE 03/07/2017 2106   UROBILINOGEN 1.0 06/29/2011 1721   NITRITE NEGATIVE 03/07/2017 2106   LEUKOCYTESUR NEGATIVE 03/07/2017 2106   LEUKOCYTESUR Negative 08/01/2012 1657     RADIOLOGY: Dg Chest 1 View  Result Date: 03/07/2017 CLINICAL DATA:  Short of breath EXAM: CHEST 1 VIEW COMPARISON:  02/26/2015 FINDINGS: Post sternotomy changes. Increased left pleural effusion. Cardiomegaly with vascular congestion. Development of a small right pleural effusion. Dense left basilar consolidation. Aortic atherosclerosis. No pneumothorax. IMPRESSION: 1. Small bilateral left greater than right pleural effusions increased compared to prior 2. Cardiomegaly with vascular congestion 3. Increasing atelectasis or pneumonia at the left lung base. Electronically Signed   By: Jasmine PangKim  Fujinaga M.D.   On: 03/07/2017 21:08    EKG: Orders placed or performed during the hospital encounter of 03/07/17  . ED EKG  . ED EKG    IMPRESSION AND PLAN:  1.  Acute hypoxic respiratory failure, secondary to acute CHF exacerbation.  We will start IV diuresis with Lasix and oxygen therapy as needed. 2.  Acute CHF exacerbation.  BNP is over 4500. The precipitating factor is unclear. We will start IV diuresis with Lasix and oxygen therapy as needed.  Continue ASA, Coreg, statin and lisinopril. We will monitor patient on  telemetry and follow the troponin level.  Check echocardiogram.  Will consult cardiology for further evaluation and management. 3.  CKD 3, stable creatinine at baseline 1.6.  We will continue to monitor kidney function closely and avoid nephrotoxic medications. 4.  Mild elevation in troponin level, chronic, likely related to demand ischemia.  Will follow troponin level to rule out acute coronary syndrome.  5.  Generalized muscle weakness and unstable gait.  Will have PT/OT evaluate the patient.   All the records are reviewed and case discussed with ED provider. Management plans discussed with the patient, family and they are in agreement.  CODE STATUS: Code Status History    Date Active Date Inactive Code Status Order ID Comments User Context   06/30/2011 15:57 07/07/2011 13:38 Full Code [id number]  Ponciano OrtHardy, Samantha Rae, RN Inpatient       TOTAL TIME TAKING CARE OF THIS PATIENT: 45 minutes.    Cammy CopaAngela Saranda Legrande M.D on 03/07/2017 at 11:02 PM  Between 7am to 6pm - Pager - [phone]  After 6pm go to [email or web address] - password [email or web address] [facility]  [facility]  Office  [phone]  CC: Primary care physician; Jaclyn Shaggyate, Denny C, MD

## 2017-03-07 NOTE — ED Triage Notes (Signed)
Pt comes into the ED via POV c/o testicular swelling and lower abdominal swelling.  Patient state this started about 3 weeks ago and has progressed to the point where it is unbearable.  Patient states he has vascular problems where they also wrap his legs and he is concerned that this may be related.  Patient denies N/V/D, chest pain, or shortness of breath.  Patient states he has some difficulty urinating with the new onset of swelling.

## 2017-03-07 NOTE — ED Provider Notes (Signed)
[facility] Emergency Department Provider Note    None    (approximate)  I have reviewed the triage vital signs and the nursing notes.   HISTORY  Chief Complaint Groin Swelling and Abdominal Pain    HPI [name] [name] is a 72 y.o. male with a history of CAD status post bypass with hypertension and hypothyroidism presents with 3 weeks of worsening lower extremity swelling now progressively to his testicles and abdomen.  States he feels his abdomen is more distended.  States he does feel more short of breath particularly on laying flat.  Denies any chest pain at this time.  He is been compliant with his home medications.  No change in his diet or new medications reported.  States that it was primarily his worsening testicular pain that brought him to the ER today.  Denies any pain truly in his testicles but more from irritated skin from rubbing his legs.    Past Medical History:  Diagnosis Date  . Coronary artery disease   . Hyperlipidemia   . Hypertension   . Hypothyroidism   . 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 Lady GaryFath thinks I've had 2 that I didn't know about"  . Peripheral vascular disease (HCC) 06/26/11   "blockages"  . Pneumonia 1950-1960's  . Shortness of breath 06/26/11   "all the time for a good while"   Family History  Problem Relation Age of Onset  . Heart disease Mother [id number]  . Hypertension Brother   . Arrhythmia Brother    Past Surgical History:  Procedure Laterality Date  . CARDIAC CATHETERIZATION  06/24/11  . CORONARY ARTERY BYPASS GRAFT  06/30/2011   Procedure: CORONARY ARTERY BYPASS GRAFTING (CABG);  Surgeon: Loreli SlotSteven C Hendrickson, MD;  [facility];  Service: Open Heart Surgery;  Laterality: N/A;  . TONSILLECTOMY     "as a kid"   Patient Active Problem List   Diagnosis Date Noted  . Bilateral lower extremity edema 02/23/2017  . PAD (peripheral artery disease) (HCC) 02/23/2017  . CAD (coronary artery disease) 06/26/2011  .  Ischemic cardiomyopathy 06/26/2011  . Hyperlipidemia 06/26/2011  . Hypertension 06/26/2011  . Carotid artery disease (HCC) 06/26/2011  . Tobacco abuse 06/26/2011  . Hypothyroidism 06/26/2011      Prior to Admission medications   Medication Sig Start Date End Date Taking? Authorizing Provider  amoxicillin (AMOXIL) 500 MG capsule  02/02/17   [provider]  aspirin EC 81 MG tablet Take 81 mg by mouth daily.    [provider]  atorvastatin (LIPITOR) 20 MG tablet Take 20 mg by mouth at bedtime.    [provider]  carvedilol (COREG) 3.125 MG tablet Take 1 tablet (3.125 mg total) by mouth 2 (two) times daily with a meal. 07/09/11 07/08/12  Doree FudgeZimmerman, Donielle M, PA-C  furosemide (LASIX) 40 MG tablet Take 40 mg by mouth every morning. 12/29/16   [provider]  HYDROcodone-acetaminophen (NORCO/VICODIN) 5-325 MG tablet Take 1 tablet by mouth every 4 (four) hours as needed for moderate pain. Patient not taking: Reported on 02/23/2017 02/26/15   Tommi RumpsSummers, Rhonda L, PA-C  levothyroxine (SYNTHROID, LEVOTHROID) 100 MCG tablet TAKE 1 TABLET BY MOUTH DAILY AND TAKE 1 AND 1/2 TABLETS ON WED AND SAT 12/06/16   [provider]  lisinopril (PRINIVIL,ZESTRIL) 10 MG tablet Take 10 mg by mouth daily. 12/14/16   [provider]  Rivaroxaban (XARELTO) 20 MG TABS Take 20 mg by mouth daily.    [provider]  sulfamethoxazole-trimethoprim (BACTRIM DS,SEPTRA DS) 800-160 MG tablet Take 1 tablet by mouth 2 (two) times daily. Patient not taking: Reported on 02/23/2017 02/26/15   Tommi Rumps, PA-C    Allergies Patient has no known allergies.    Social History Social History   Tobacco Use  . Smoking status: Former Smoker    Packs/day: 1.00    Years: 50.00    Pack years: 50.00    Types: Cigarettes    Last attempt to quit: 06/23/2011    Years since quitting: 5.7  . Smokeless tobacco: Never Used  Substance Use Topics  . Alcohol use: Yes     Comment: 06/26/11 "every once in awhile I'd drink a beer; that's about come to an end too"  . Drug use: No    Review of Systems Patient denies headaches, rhinorrhea, blurry vision, numbness, shortness of breath, chest pain, edema, cough, abdominal pain, nausea, vomiting, diarrhea, dysuria, fevers, rashes or hallucinations unless otherwise stated above in HPI. ____________________________________________   PHYSICAL EXAM:  VITAL SIGNS: Vitals:   03/07/17 1957  BP: 119/74  Pulse: 63  Resp: 18  Temp: 97.8 F (36.6 C)  SpO2: 94%    Constitutional: Alert and oriented. Well appearing and in no acute distress. Eyes: Conjunctivae are normal.  Head: Atraumatic. Nose: No congestion/rhinnorhea. Mouth/Throat: Mucous membranes are moist.   Neck: No stridor. Painless ROM.  Cardiovascular: Normal rate, regular rhythm. Grossly normal heart sounds.  Good peripheral circulation. Respiratory: Normal respiratory effort.  No retractions. Lungs with posterior crackles, Gastrointestinal: Soft and nontender. + distention with fluid wave. No abdominal bruits. No CVA tenderness. Genitourinary: edema scrotum and groin area without crepitus or erythema Musculoskeletal: No lower extremity tenderness, tense bilateral edema above thighs. edema.  No joint effusions. Neurologic:  Normal speech and language. No gross focal neurologic deficits are appreciated. No facial droop Skin:  Skin is warm, dry and intact. No rash noted. Psychiatric: Mood and affect are normal. Speech and behavior are normal.  ____________________________________________   LABS (all labs ordered are listed, but only abnormal results are displayed)  Results for orders placed or performed during the hospital encounter of 03/07/17 (from the past 24 hour(s))  Comprehensive metabolic panel     Status: Abnormal   Collection Time: 03/07/17  8:02 PM  Result Value Ref Range   Sodium 135 135 - 145 mmol/L   Potassium 3.8 3.5 - 5.1 mmol/L    Chloride 102 101 - 111 mmol/L   CO2 24 22 - 32 mmol/L   Glucose, Bld 107 (H) 65 - 99 mg/dL   BUN 22 (H) 6 - 20 mg/dL   Creatinine, Ser 4.54 (H) 0.61 - 1.24 mg/dL   Calcium 8.6 (L) 8.9 - 10.3 mg/dL   Total Protein 7.2 6.5 - 8.1 g/dL   Albumin 3.2 (L) 3.5 - 5.0 g/dL   AST 24 15 - 41 U/L   ALT 8 (L) 17 - 63 U/L   Alkaline Phosphatase 204 (H) 38 - 126 U/L   Total Bilirubin 1.6 (H) 0.3 - 1.2 mg/dL   GFR calc non Af Amer 41 (L) >60 mL/min   GFR calc Af Amer 48 (L) >60 mL/min   Anion gap 9 5 - 15  CBC with Differential     Status: Abnormal   Collection Time: 03/07/17  8:02 PM  Result Value Ref Range   WBC 6.3 3.8 - 10.6 K/uL   RBC 4.56 4.40 - 5.90 MIL/uL   Hemoglobin 12.3 (L) 13.0 - 18.0  g/dL   HCT 16.1 (L) 09.6 - 04.5 %   MCV 82.1 80.0 - 100.0 fL   MCH 27.0 26.0 - 34.0 pg   MCHC 32.8 32.0 - 36.0 g/dL   RDW 40.9 (H) 81.1 - 91.4 %   Platelets 244 150 - 440 K/uL   Neutrophils Relative % 78 %   Neutro Abs 4.9 1.4 - 6.5 K/uL   Lymphocytes Relative 7 %   Lymphs Abs 0.4 (L) 1.0 - 3.6 K/uL   Monocytes Relative 12 %   Monocytes Absolute 0.8 0.2 - 1.0 K/uL   Eosinophils Relative 2 %   Eosinophils Absolute 0.1 0 - 0.7 K/uL   Basophils Relative 1 %   Basophils Absolute 0.1 0 - 0.1 K/uL  Troponin I     Status: Abnormal   Collection Time: 03/07/17  8:02 PM  Result Value Ref Range   Troponin I 0.04 (HH) <0.03 ng/mL  Brain natriuretic peptide     Status: Abnormal   Collection Time: 03/07/17  8:02 PM  Result Value Ref Range   B Natriuretic Peptide >4,500.0 (H) 0.0 - 100.0 pg/mL  Urinalysis, Complete w Microscopic     Status: Abnormal   Collection Time: 03/07/17  9:06 PM  Result Value Ref Range   Color, Urine YELLOW (A) YELLOW   APPearance CLEAR (A) CLEAR   Specific Gravity, Urine 1.014 1.005 - 1.030   pH 5.0 5.0 - 8.0   Glucose, UA NEGATIVE NEGATIVE mg/dL   Hgb urine dipstick SMALL (A) NEGATIVE   Bilirubin Urine NEGATIVE NEGATIVE   Ketones, ur NEGATIVE NEGATIVE mg/dL   Protein, ur  NEGATIVE NEGATIVE mg/dL   Nitrite NEGATIVE NEGATIVE   Leukocytes, UA NEGATIVE NEGATIVE   RBC / HPF 0-5 0 - 5 RBC/hpf   WBC, UA 0-5 0 - 5 WBC/hpf   Bacteria, UA NONE SEEN NONE SEEN   Squamous Epithelial / LPF NONE SEEN NONE SEEN   Mucus PRESENT    Hyaline Casts, UA PRESENT   Protime-INR     Status: Abnormal   Collection Time: 03/07/17  9:06 PM  Result Value Ref Range   Prothrombin Time 15.7 (H) 11.4 - 15.2 seconds   INR 1.26    ____________________________________________  EKG My review and personal interpretation at Time: 20:06   Indication: swelling  Rate: 60  Rhythm: sinus Axis: normal Other: rbbb, normal intervals otherwise, no stemi ____________________________________________  RADIOLOGY  I personally reviewed all radiographic images ordered to evaluate for the above acute complaints and reviewed radiology reports and findings.  These findings were personally discussed with the patient.  Please see medical record for radiology report.  ____________________________________________   PROCEDURES  Procedure(s) performed:  Procedures    Critical Care performed: no ____________________________________________   INITIAL IMPRESSION / ASSESSMENT AND PLAN / ED COURSE  Pertinent labs & imaging results that were available during my care of the patient were reviewed by me and considered in my medical decision making (see chart for details).  DDX: chf, anasarca, aki, dvt, fourniers, hernia, ascites    [name] [name] is a 72 y.o. who presents to the ED with was as described above.  Not clinically consistent with infectious process.  Most consistent with decompensated congestive heart failure with volume overload.  Troponin is mildly elevated as well as significantly elevated BNP.  His renal function is fortunately normal at this point.  Chest x-ray shows evidence of worsening vascular congestion and bilateral effusions.  While evaluating patient while resting is found to be  percent on pulse oximetry therefore he was placed on 2 L nasal cannula for his acute respiratory failure with hypoxia secondary to pulmonary edema.  Patient given IV Lasix and will require hospitalization for further medical management.  Have discussed with the patient and available family all diagnostics and treatments performed thus far and all questions were answered to the best of my ability. The patient demonstrates understanding and agreement with plan.      ____________________________________________   FINAL CLINICAL IMPRESSION(S) / ED DIAGNOSES  Final diagnoses:  Acute respiratory failure with hypoxia (HCC)  Congestive heart failure, unspecified HF chronicity, unspecified heart failure type (HCC)      NEW MEDICATIONS STARTED DURING THIS VISIT:  New Prescriptions   No medications on file     Note:  This document was prepared using Dragon voice recognition software and may include unintentional dictation errors.    Willy Eddy, MD 03/07/17 2149

## 2017-03-08 ENCOUNTER — Other Ambulatory Visit: Payer: Self-pay

## 2017-03-08 ENCOUNTER — Inpatient Hospital Stay
Admit: 2017-03-08 | Discharge: 2017-03-08 | Disposition: A | Discharge status: Home / Self Care | Payer: Medicare HMO | Attending: Internal Medicine | Admitting: Internal Medicine

## 2017-03-08 LAB — CBC
HEMATOCRIT: 34.4 % — AB (ref 40.0–52.0)
HEMOGLOBIN: 11.3 g/dL — AB (ref 13.0–18.0)
MCH: 26.8 pg (ref 26.0–34.0)
MCHC: 32.7 g/dL (ref 32.0–36.0)
MCV: 81.8 fL (ref 80.0–100.0)
Platelets: 235 10*3/uL (ref 150–440)
RBC: 4.2 MIL/uL — ABNORMAL LOW (ref 4.40–5.90)
RDW: 19.5 % — ABNORMAL HIGH (ref 11.5–14.5)
WBC: 6.2 10*3/uL (ref 3.8–10.6)

## 2017-03-08 LAB — BASIC METABOLIC PANEL
ANION GAP: 9 (ref 5–15)
BUN: 23 mg/dL — ABNORMAL HIGH (ref 6–20)
CO2: 23 mmol/L (ref 22–32)
Calcium: 8.2 mg/dL — ABNORMAL LOW (ref 8.9–10.3)
Chloride: 104 mmol/L (ref 101–111)
Creatinine, Ser: 1.54 mg/dL — ABNORMAL HIGH (ref 0.61–1.24)
GFR calc Af Amer: 51 mL/min — ABNORMAL LOW (ref >60)
GFR calc non Af Amer: 44 mL/min — ABNORMAL LOW (ref >60)
GLUCOSE: 114 mg/dL — AB (ref 65–99)
POTASSIUM: 3.6 mmol/L (ref 3.5–5.1)
Sodium: 136 mmol/L (ref 135–145)

## 2017-03-08 LAB — ECHOCARDIOGRAM COMPLETE
Height: 72 in
WEIGHTICAEL: 2721.6 [oz_av]

## 2017-03-08 LAB — GLUCOSE, CAPILLARY: Glucose-Capillary: 119 mg/dL — ABNORMAL HIGH (ref 65–99)

## 2017-03-08 LAB — TSH: TSH: 19.956 u[IU]/mL — ABNORMAL HIGH (ref 0.350–4.500)

## 2017-03-08 MED ORDER — HYDROCORTISONE 1 % EX CREA
TOPICAL_CREAM | CUTANEOUS | Status: DC | PRN
  Administered 2017-03-09: 12:00:00 via TOPICAL
  Filled 2017-03-08: qty 28

## 2017-03-08 MED ORDER — SODIUM CHLORIDE 0.9% FLUSH
3.0000 mL | Freq: Two times a day (BID) | INTRAVENOUS | Status: DC
  Administered 2017-03-08 – 2017-03-11 (×6): 3 mL via INTRAVENOUS

## 2017-03-08 MED ORDER — DIPHENHYDRAMINE HCL 25 MG PO TABS
25.0000 mg | ORAL_TABLET | Freq: Four times a day (QID) | ORAL | Status: DC | PRN
  Administered 2017-03-08 – 2017-03-11 (×6): 25 mg via ORAL
  Filled 2017-03-08 (×11): qty 1

## 2017-03-08 MED ORDER — HYDROCODONE-ACETAMINOPHEN 5-325 MG PO TABS: 1.0000 | ORAL_TABLET | ORAL | Status: DC | PRN

## 2017-03-08 MED ORDER — ATORVASTATIN CALCIUM 20 MG PO TABS
20.0000 mg | ORAL_TABLET | Freq: Every day | ORAL | Status: DC
  Administered 2017-03-08 – 2017-03-10 (×3): 20 mg via ORAL
  Filled 2017-03-08 (×3): qty 1

## 2017-03-08 MED ORDER — ORAL CARE MOUTH RINSE
15.0000 mL | Freq: Two times a day (BID) | OROMUCOSAL | Status: DC
  Administered 2017-03-10: 15 mL via OROMUCOSAL

## 2017-03-08 MED ORDER — LEVOTHYROXINE SODIUM 100 MCG PO TABS
100.0000 ug | ORAL_TABLET | Freq: Every day | ORAL | Status: DC
  Administered 2017-03-08 – 2017-03-09 (×2): 100 ug via ORAL
  Filled 2017-03-08 (×2): qty 1

## 2017-03-08 MED ORDER — BISACODYL 5 MG PO TBEC: 5.0000 mg | DELAYED_RELEASE_TABLET | Freq: Every day | ORAL | Status: DC | PRN

## 2017-03-08 MED ORDER — ONDANSETRON HCL 4 MG PO TABS: 4.0000 mg | ORAL_TABLET | Freq: Four times a day (QID) | ORAL | Status: DC | PRN

## 2017-03-08 MED ORDER — DOCUSATE SODIUM 100 MG PO CAPS
100.0000 mg | ORAL_CAPSULE | Freq: Two times a day (BID) | ORAL | Status: DC
  Administered 2017-03-08 – 2017-03-11 (×7): 100 mg via ORAL
  Filled 2017-03-08 (×7): qty 1

## 2017-03-08 MED ORDER — CARVEDILOL 3.125 MG PO TABS
3.1250 mg | ORAL_TABLET | Freq: Two times a day (BID) | ORAL | Status: DC
  Administered 2017-03-08 – 2017-03-09 (×4): 3.125 mg via ORAL
  Filled 2017-03-08 (×4): qty 1

## 2017-03-08 MED ORDER — TRAZODONE HCL 50 MG PO TABS: 25.0000 mg | ORAL_TABLET | Freq: Every evening | ORAL | Status: DC | PRN

## 2017-03-08 MED ORDER — ACETAMINOPHEN 325 MG PO TABS: 650.0000 mg | ORAL_TABLET | Freq: Four times a day (QID) | ORAL | Status: DC | PRN

## 2017-03-08 MED ORDER — RIVAROXABAN 20 MG PO TABS
20.0000 mg | ORAL_TABLET | Freq: Every day | ORAL | Status: DC
  Filled 2017-03-08 (×2): qty 1

## 2017-03-08 MED ORDER — ASPIRIN EC 81 MG PO TBEC
81.0000 mg | DELAYED_RELEASE_TABLET | Freq: Every day | ORAL | Status: DC
  Administered 2017-03-08 – 2017-03-11 (×4): 81 mg via ORAL
  Filled 2017-03-08 (×4): qty 1

## 2017-03-08 MED ORDER — FUROSEMIDE 10 MG/ML IJ SOLN
80.0000 mg | Freq: Two times a day (BID) | INTRAMUSCULAR | Status: DC
  Administered 2017-03-08 – 2017-03-09 (×4): 80 mg via INTRAVENOUS
  Filled 2017-03-08 (×4): qty 8

## 2017-03-08 MED ORDER — ACETAMINOPHEN 650 MG RE SUPP: 650.0000 mg | Freq: Four times a day (QID) | RECTAL | Status: DC | PRN

## 2017-03-08 MED ORDER — LISINOPRIL 10 MG PO TABS
10.0000 mg | ORAL_TABLET | Freq: Every day | ORAL | Status: DC
  Administered 2017-03-08 – 2017-03-09 (×2): 10 mg via ORAL
  Filled 2017-03-08 (×2): qty 1

## 2017-03-08 MED ORDER — ONDANSETRON HCL 4 MG/2ML IJ SOLN: 4.0000 mg | Freq: Four times a day (QID) | INTRAMUSCULAR | Status: DC | PRN

## 2017-03-08 NOTE — Progress Notes (Signed)
[employer] [facility] [facility] [place] at [facility]   PATIENT NAME: Marko PlumeJames Benally    MR#:  [id number]  DATE OF BIRTH:  [birth year]  SUBJECTIVE:  CHIEF COMPLAINT:   Chief Complaint  Patient presents with  . Groin Swelling  . Abdominal Pain  still feels swollen, Neg 1.9 liters, not sure who inserted foley (says likely was in ED) REVIEW OF SYSTEMS:  Review of Systems  Constitutional: Positive for malaise/fatigue. Negative for chills, fever and weight loss.  HENT: Negative for nosebleeds and sore throat.   Eyes: Negative for blurred vision.  Respiratory: Positive for shortness of breath. Negative for cough and wheezing.   Cardiovascular: Positive for leg swelling. Negative for chest pain, orthopnea and PND.  Gastrointestinal: Negative for abdominal pain, constipation, diarrhea, heartburn, nausea and vomiting.  Genitourinary: Negative for dysuria and urgency.  Musculoskeletal: Negative for back pain.  Skin: Negative for rash.  Neurological: Positive for weakness. Negative for dizziness, speech change, focal weakness and headaches.  Endo/Heme/Allergies: Does not bruise/bleed easily.  Psychiatric/Behavioral: Negative for depression.    DRUG ALLERGIES:  No Known Allergies VITALS:  Blood pressure 138/61, pulse 65, temperature 98.4 F (36.9 C), temperature source Oral, resp. rate 20, height 6' (1.829 m), weight 77.2 kg (170 lb 1.6 oz), SpO2 96 %. PHYSICAL EXAMINATION:  Physical Exam  Constitutional: He is oriented to person, place, and time and well-developed, well-nourished, and in no distress.  HENT:  Head: Normocephalic and atraumatic.  Eyes: Conjunctivae and EOM are normal. Pupils are equal, round, and reactive to light.  Neck: Normal range of motion. Neck supple. No tracheal deviation present. No thyromegaly present.  Cardiovascular: Normal rate, regular rhythm and normal heart sounds.  Pulmonary/Chest: Effort normal and breath sounds normal. No respiratory distress. He has  no wheezes. He exhibits no tenderness.  Abdominal: Soft. Bowel sounds are normal. He exhibits no distension. There is no tenderness.  Musculoskeletal: Normal range of motion. He exhibits edema.  Neurological: He is alert and oriented to person, place, and time. No cranial nerve deficit.  Skin: Skin is warm and dry. No rash noted.  Psychiatric: Mood and affect normal.   LABORATORY PANEL:  Male CBC Recent Labs  Lab 03/08/17 0417  WBC 6.2  HGB 11.3*  HCT 34.4*  PLT 235   ------------------------------------------------------------------------------------------------------------------ Chemistries  Recent Labs  Lab 03/07/17 2002 03/08/17 0417  NA 135 136  K 3.8 3.6  CL 102 104  CO2 24 23  GLUCOSE 107* 114*  BUN 22* 23*  CREATININE 1.61* 1.54*  CALCIUM 8.6* 8.2*  AST 24  --   ALT 8*  --   ALKPHOS 204*  --   BILITOT 1.6*  --    RADIOLOGY:  Dg Chest 1 View  Result Date: 03/07/2017 CLINICAL DATA:  Short of breath EXAM: CHEST 1 VIEW COMPARISON:  02/26/2015 FINDINGS: Post sternotomy changes. Increased left pleural effusion. Cardiomegaly with vascular congestion. Development of a small right pleural effusion. Dense left basilar consolidation. Aortic atherosclerosis. No pneumothorax. IMPRESSION: 1. Small bilateral left greater than right pleural effusions increased compared to prior 2. Cardiomegaly with vascular congestion 3. Increasing atelectasis or pneumonia at the left lung base. Electronically Signed   By: Jasmine PangKim  Fujinaga M.D.   On: 03/07/2017 21:08   ASSESSMENT AND PLAN:   1.  Acute hypoxic respiratory failure, secondary to acute CHF exacerbation: continue IV diuresis with Lasix and oxygen therapy as needed. 2.  Acute CHF exacerbation.  BNP is over 4500. The precipitating factor is unclear. continue IV  diuresis with Lasix and oxygen therapy as needed.  Continue ASA, Coreg, statin and lisinopril.  Neg 1.9 liters - monitor patient on telemetry and follow the troponin level.   pending echocardiogram.  pending consult cardiology for further evaluation and management. 3.  CKD 3, stable creatinine at baseline 1.6.  - continue to monitor kidney function closely and avoid nephrotoxic medications. 4.  Mild elevation in troponin level, chronic, likely due to demand ischemia.  Will follow troponin level to rule out acute coronary syndrome.  5.  Generalized muscle weakness and unstable gait.   PT/OT to evaluate the patient.    Remove Foley    All the records are reviewed and case discussed with Care Management/Social Worker. Management plans discussed with the patient, nursing and they are in agreement.  CODE STATUS: Full Code  TOTAL TIME TAKING CARE OF THIS PATIENT: 35 minutes.   More than 50% of the time was spent in counseling/coordination of care: YES  POSSIBLE D/C IN 1-2 DAYS, DEPENDING ON CLINICAL CONDITION.   Delfino Lovett M.D on 03/08/2017 at 6:40 PM  Between 7am to 6pm - Pager - [phone]  After 6pm go to [email or web address] - Social research officer, government  [facility]  Office  [phone]  CC: Primary care physician; Jaclyn Shaggy, MD  Note: This dictation was prepared with Dragon dictation along with smaller phrase technology. Any transcriptional errors that result from this process are unintentional.

## 2017-03-08 NOTE — Evaluation (Signed)
Occupational Therapy Evaluation Patient Details Name: [name] [name] MRN: [id number] DOB: [birth year] Today's Date: 03/08/2017    History of Present Illness Pt is a71 y.o.malewith a known history of coronary artery disease,status post old MI,status post CABG in the past,hyperlipidemia,hypertension,peripheral vascular disease and venous insufficiency.   Clinical Impression   Pt is 72 year old male who presents to [facility] hospital with CHF exacerbation with BLE edema and pain.  He lives at home alone and was independent in all ADLs prior to admission.  He has a son, daughter and 6 grandchildren in the area who can assist him if needed.  He currently requires min assist for ADLs due to pain in LEs and scrotal area from edema with decreased standing tolerance due to pain, decreased endurance for functional tasks and at risk for falls.  Pt would benefit from skilled OT services to increase independence in ADLs, education in AD for LB dressing and recommendations for home modifications to increase safety and prevent falls.  Pt would benefit from continued OT at home via home health.      Follow Up Recommendations  Home health OT    Equipment Recommendations       Recommendations for Other Services       Precautions / Restrictions Precautions Precautions: Fall Restrictions Weight Bearing Restrictions: No      Mobility Bed Mobility Overal bed mobility: Modified Independent             General bed mobility comments: Some extra time and effort during bed mobility tasks exacerbated by scrotal swelling  Transfers Overall transfer level: Needs assistance Equipment used: Rolling walker (2 wheeled) Transfers: Sit to/from Stand Sit to Stand: Supervision         General transfer comment: Good eccentric and concentric control during transfers without instability    Balance Overall balance assessment: Needs assistance Sitting-balance support: Feet supported;No upper extremity  supported Sitting balance-Leahy Scale: Normal     Standing balance support: Bilateral upper extremity supported Standing balance-Leahy Scale: Good                             ADL either performed or assessed with clinical judgement   ADL Overall ADL's : Needs assistance/impaired Eating/Feeding: Independent;Set up   Grooming: Wash/dry hands;Wash/dry face;Oral care;Set up;Independent           Upper Body Dressing : Independent;Set up   Lower Body Dressing: Set up;Minimal assistance;With adaptive equipment;Sit to/from stand Lower Body Dressing Details (indicate cue type and reason): sitting EOB---rec trying sitting in chair tomorrow and sit up for a few hours since he has mainly been in bed for last 24 hours.               General ADL Comments: Pt has BLE edema and pain which limits his ability to reach his feet safely and without pain.  Pt able to demonstrate teach back with AD sitting EOB but has increased scrotal pain and pain in LEs when standing for more than a few minutes using FWW.  Rec using a reacher and sock aid for LB dressing skills.  He lives at home alone and discussed safety considerations with use of FWW and set up in bathroom at home.  He has a shower chair he can use at home in the bath tub and can reach sink and counter from the toilet since bathroom is very small.       Vision Patient Visual Report: No  change from baseline       Perception     Praxis      Pertinent Vitals/Pain Pain Assessment: No/denies pain     Hand Dominance Right   Extremity/Trunk Assessment Upper Extremity Assessment Upper Extremity Assessment: Overall WFL for tasks assessed   Lower Extremity Assessment Lower Extremity Assessment: Defer to PT evaluation       Communication Communication Communication: No difficulties   Cognition Arousal/Alertness: Awake/alert Behavior During Therapy: WFL for tasks assessed/performed Overall Cognitive Status: Within  Functional Limits for tasks assessed                                     General Comments       Exercises Total Joint Exercises Ankle Circles/Pumps: AROM;Both;5 reps;10 reps Quad Sets: Strengthening;Both;5 reps;10 reps Gluteal Sets: Strengthening;Both;5 reps;10 reps Hip ABduction/ADduction: AROM;Both;10 reps Straight Leg Raises: AROM;Both;10 reps Long Arc Quad: AROM;Both;10 reps Knee Flexion: AROM;Both;10 reps Marching in Standing: AROM;Both;10 reps   Shoulder Instructions      Home Living Family/patient expects to be discharged to:: Private residence Living Arrangements: Alone Available Help at Discharge: Family;Available PRN/intermittently Type of Home: House Home Access: Stairs to enter Entergy CorporationEntrance Stairs-Number of Steps: 4 Entrance Stairs-Rails: Right;Left Home Layout: One level     Bathroom Shower/Tub: Chief Strategy OfficerTub/shower unit   Bathroom Toilet: Standard Bathroom Accessibility: Yes How Accessible: Accessible via walker Home Equipment: Cane - single point;Shower seat          Prior Functioning/Environment Level of Independence: Independent        Comments: Ind amb without AD limited community distances with no fall history, Ind with ADLs        OT Problem List: Decreased strength;Decreased activity tolerance;Pain;Decreased knowledge of use of DME or AE      OT Treatment/Interventions: Self-care/ADL training;Patient/family education;DME and/or AE instruction    OT Goals(Current goals can be found in the care plan section) Acute Rehab OT Goals Patient Stated Goal: "to do more for myself and not need any help again" OT Goal Formulation: With patient Time For Goal Achievement: 03/22/17 Potential to Achieve Goals: Good ADL Goals Pt Will Perform Lower Body Dressing: with set-up;with supervision;with adaptive equipment;sit to/from stand Pt Will Transfer to Toilet: with set-up;with supervision;regular height toilet;stand pivot transfer  OT Frequency: Min  1X/week   Barriers to D/C:    lives at home alone       Co-evaluation              AM-PAC PT "6 Clicks" Daily Activity     Outcome Measure Help from another person eating meals?: None Help from another person taking care of personal grooming?: None Help from another person toileting, which includes using toliet, bedpan, or urinal?: A Little Help from another person bathing (including washing, rinsing, drying)?: A Little Help from another person to put on and taking off regular upper body clothing?: None Help from another person to put on and taking off regular lower body clothing?: A Little 6 Click Score: 21   End of Session    Activity Tolerance: Patient tolerated treatment well Patient left: in bed;with call bell/phone within reach;with bed alarm set  OT Visit Diagnosis: Unsteadiness on feet (R26.81);Pain;Muscle weakness (generalized) (M62.81) Pain - Right/Left: Right(bilateral LEs) Pain - part of body: Leg                Time: [id number] OT Time Calculation (min): 29 min Charges:  OT General Charges $OT Visit: 1 Visit OT Evaluation $OT Eval Low Complexity: 1 Low OT Treatments $Self Care/Home Management : 8-22 mins G-Codes:     Susanne Borders, OTR/L ascom [phone] 03/08/17, 4:13 PM

## 2017-03-08 NOTE — Evaluation (Signed)
Physical Therapy Evaluation Patient Details Name: [name] BlewJames A Brucks MRN: [id number] DOB: 1945/04/25 Today's Date: 03/08/2017   History of Present Illness  Pt is a71 y.o.malewith a known history of coronary artery disease,status post old MI,status post CABG in the past,hyperlipidemia,hypertension,peripheral vascular disease and venous insufficiency.    Clinical Impression  Pt presents with minor deficits in strength, transfers, mobility, gait, and balance, and with moderate deficits in activity tolerance.  Pt found on 2LO2/min with SpO2 99% and HR 63 bpm with pt not on O2 at home.  Per nursing OK for trial with PT on room air.  Pt required extra time and effort during bed mobility tasks but no physical assistance.  After bed mobility and therex pt's SpO2 on room air 95% with HR 67 bpm.  Pt able to amb 20' with RW but was limited by scrotal pain and requested to return to sitting.  Pt's SpO2 remained at 95% after amb with HR 70 bpm.  Nursing notified and stated to leave pt on room air.  Pt will benefit from HHPT services upon discharge to safely address above deficits for decreased caregiver assistance and eventual return to PLOF.      Follow Up Recommendations Home health PT    Equipment Recommendations  Other (comment);Rolling walker with 5" wheels(Pt may advance to not requiring AD during stay in acute care, please follow up)    Recommendations for Other Services       Precautions / Restrictions Precautions Precautions: Fall Restrictions Weight Bearing Restrictions: No      Mobility  Bed Mobility Overal bed mobility: Modified Independent             General bed mobility comments: Some extra time and effort during bed mobility tasks exacerbated by scrotal swelling  Transfers Overall transfer level: Needs assistance Equipment used: Rolling walker (2 wheeled) Transfers: Sit to/from Stand Sit to Stand: Supervision         General transfer comment: Good eccentric  and concentric control during transfers without instability  Ambulation/Gait Ambulation/Gait assistance: Supervision Ambulation Distance (Feet): 20 Feet Assistive device: Rolling walker (2 wheeled) Gait Pattern/deviations: Step-through pattern;Decreased step length - right;Decreased step length - left;Wide base of support   Gait velocity interpretation: Below normal speed for age/gender General Gait Details: Cautious gait secondary to scrotal discomfort in standing with scrotal pain limiting amb distance more so than deficits in strength or activity tolerance.   Stairs Stairs: (Deferred)          Wheelchair Mobility    Modified Rankin (Stroke Patients Only)       Balance Overall balance assessment: Needs assistance Sitting-balance support: Feet supported;No upper extremity supported Sitting balance-Leahy Scale: Normal     Standing balance support: Bilateral upper extremity supported Standing balance-Leahy Scale: Good                               Pertinent Vitals/Pain Pain Assessment: No/denies pain    Home Living Family/patient expects to be discharged to:: Private residence Living Arrangements: Alone Available Help at Discharge: Family;Available PRN/intermittently Type of Home: House Home Access: Stairs to enter Entrance Stairs-Rails: Right;Left(Can not reach both) Entrance Stairs-Number of Steps: 4 Home Layout: One level Home Equipment: Cane - single point      Prior Function Level of Independence: Independent         Comments: Ind amb without AD limited community distances with no fall history, Ind with ADLs  Hand Dominance   Dominant Hand: Right    Extremity/Trunk Assessment   Upper Extremity Assessment Upper Extremity Assessment: Overall WFL for tasks assessed    Lower Extremity Assessment Lower Extremity Assessment: Generalized weakness       Communication   Communication: No difficulties  Cognition Arousal/Alertness:  Awake/alert Behavior During Therapy: WFL for tasks assessed/performed Overall Cognitive Status: Within Functional Limits for tasks assessed                                        General Comments      Exercises Total Joint Exercises Ankle Circles/Pumps: AROM;Both;5 reps;10 reps Quad Sets: Strengthening;Both;5 reps;10 reps Gluteal Sets: Strengthening;Both;5 reps;10 reps Hip ABduction/ADduction: AROM;Both;10 reps Straight Leg Raises: AROM;Both;10 reps Long Arc Quad: AROM;Both;10 reps Knee Flexion: AROM;Both;10 reps Marching in Standing: AROM;Both;10 reps   Assessment/Plan    PT Assessment Patient needs continued PT services  PT Problem List Decreased strength;Decreased activity tolerance;Decreased balance;Decreased mobility       PT Treatment Interventions Gait training;DME instruction;Stair training;Functional mobility training;Balance training;Therapeutic exercise;Therapeutic activities;Patient/family education    PT Goals (Current goals can be found in the Care Plan section)  Acute Rehab PT Goals Patient Stated Goal: To get stronger PT Goal Formulation: With patient Time For Goal Achievement: 03/21/17 Potential to Achieve Goals: Good    Frequency Min 2X/week   Barriers to discharge        Co-evaluation               AM-PAC PT "6 Clicks" Daily Activity  Outcome Measure Difficulty turning over in bed (including adjusting bedclothes, sheets and blankets)?: A Little Difficulty moving from lying on back to sitting on the side of the bed? : A Little Difficulty sitting down on and standing up from a chair with arms (e.g., wheelchair, bedside commode, etc,.)?: A Little Help needed moving to and from a bed to chair (including a wheelchair)?: A Little Help needed walking in hospital room?: A Little Help needed climbing 3-5 steps with a railing? : A Little 6 Click Score: 18    End of Session Equipment Utilized During Treatment: Gait belt Activity  Tolerance: Patient tolerated treatment well Patient left: in bed;with bed alarm set;with call bell/phone within reach;Other (comment);with nursing/sitter in room(Hospital staff entered room at end of session for medical test) Nurse Communication: Mobility status;Other (comment)(Pt's vital signs of room air per assessment note) PT Visit Diagnosis: Difficulty in walking, not elsewhere classified (R26.2);Muscle weakness (generalized) (M62.81)    Time: 1610-9604 PT Time Calculation (min) (ACUTE ONLY): 31 min   Charges:   PT Evaluation $PT Eval Low Complexity: 1 Low PT Treatments $Therapeutic Exercise: 8-22 mins   PT G Codes:        DElly Modena PT, DPT 03/08/17, 2:54 PM

## 2017-03-08 NOTE — Progress Notes (Signed)
CH provided silent prayer for patient as patient was with medical staff. CH will follow-up during afternoon rounding.

## 2017-03-08 NOTE — Plan of Care (Signed)
  Progressing Education: Knowledge of General Education information will improve 03/08/2017 0421 - Progressing by Stefan Churchogers, Krishang Reading M, RN Clinical Measurements: Diagnostic test results will improve 03/08/2017 0421 - Progressing by Stefan Churchogers, Bahja Bence M, RN Respiratory complications will improve 03/08/2017 0421 - Progressing by Stefan Churchogers, Candra Wegner M, RN Activity: Capacity to carry out activities will improve 03/08/2017 0421 - Progressing by Stefan Churchogers, Param Capri M, RN

## 2017-03-08 NOTE — Plan of Care (Signed)
No c/o pain, patient diuresing with V lasix, foley catheter removed,swelling continues in legs and scrotum. Up with assist from PT today. Weaned to room air, o2 sats remain above 92% with exertion. Will continue to monitor.

## 2017-03-08 NOTE — Progress Notes (Signed)
Pt arrived to floor via stretcher. Pt A&O. Telemetry monitor applied and called to CCMD. Yellow socks placed. Pt oriented to room, callbell, bed, blooklet given. Skin assessed.

## 2017-03-08 NOTE — Progress Notes (Signed)
Foley removed from patient. Tolerated well. 325 UOP from foley. Patient educated to call for assistance when needing to use restroom. Will continue to monitor.

## 2017-03-08 NOTE — Progress Notes (Signed)
*  PRELIMINARY RESULTS* Echocardiogram 2D Echocardiogram has been performed.  [name] [name] 03/08/2017, 3:27 PM

## 2017-03-09 ENCOUNTER — Encounter (INDEPENDENT_AMBULATORY_CARE_PROVIDER_SITE_OTHER): Payer: Medicare HMO

## 2017-03-09 LAB — BASIC METABOLIC PANEL WITH GFR
Anion gap: 11 (ref 5–15)
BUN: 25 mg/dL — ABNORMAL HIGH (ref 6–20)
CO2: 22 mmol/L (ref 22–32)
Calcium: 8.1 mg/dL — ABNORMAL LOW (ref 8.9–10.3)
Chloride: 101 mmol/L (ref 101–111)
Creatinine, Ser: 1.84 mg/dL — ABNORMAL HIGH (ref 0.61–1.24)
GFR calc Af Amer: 41 mL/min — ABNORMAL LOW
GFR calc non Af Amer: 35 mL/min — ABNORMAL LOW
Glucose, Bld: 83 mg/dL (ref 65–99)
Potassium: 3.7 mmol/L (ref 3.5–5.1)
Sodium: 134 mmol/L — ABNORMAL LOW (ref 135–145)

## 2017-03-09 LAB — CBC
HCT: 34.4 % — ABNORMAL LOW (ref 40.0–52.0)
Hemoglobin: 11.3 g/dL — ABNORMAL LOW (ref 13.0–18.0)
MCH: 26.7 pg (ref 26.0–34.0)
MCHC: 32.8 g/dL (ref 32.0–36.0)
MCV: 81.2 fL (ref 80.0–100.0)
Platelets: 224 K/uL (ref 150–440)
RBC: 4.24 MIL/uL — ABNORMAL LOW (ref 4.40–5.90)
RDW: 18.8 % — ABNORMAL HIGH (ref 11.5–14.5)
WBC: 5.3 K/uL (ref 3.8–10.6)

## 2017-03-09 LAB — GLUCOSE, CAPILLARY: Glucose-Capillary: 79 mg/dL (ref 65–99)

## 2017-03-09 MED ORDER — ENOXAPARIN SODIUM 40 MG/0.4ML ~~LOC~~ SOLN
40.0000 mg | SUBCUTANEOUS | Status: DC
  Administered 2017-03-09 – 2017-03-10 (×2): 40 mg via SUBCUTANEOUS
  Filled 2017-03-09 (×2): qty 0.4

## 2017-03-09 MED ORDER — LEVOTHYROXINE SODIUM 25 MCG PO TABS
125.0000 ug | ORAL_TABLET | Freq: Every day | ORAL | Status: DC
  Administered 2017-03-10 – 2017-03-11 (×2): 125 ug via ORAL
  Filled 2017-03-09 (×2): qty 1

## 2017-03-09 NOTE — Progress Notes (Addendum)
[facility] - [place] [facility]   PATIENT NAME: [name] PlumeJames [name]    MR#:  [id number]  DATE OF BIRTH:  June 28, 1945  SUBJECTIVE:  CHIEF COMPLAINT:   Chief Complaint  Patient presents with  . Groin Swelling  . Abdominal Pain  scrotum swollen, neg 2.5 liters REVIEW OF SYSTEMS:  Review of Systems  Constitutional: Positive for malaise/fatigue. Negative for chills, fever and weight loss.  HENT: Negative for nosebleeds and sore throat.   Eyes: Negative for blurred vision.  Respiratory: Positive for shortness of breath. Negative for cough and wheezing.   Cardiovascular: Positive for leg swelling. Negative for chest pain, orthopnea and PND.  Gastrointestinal: Negative for abdominal pain, constipation, diarrhea, heartburn, nausea and vomiting.  Genitourinary: Negative for dysuria and urgency.  Musculoskeletal: Negative for back pain.  Skin: Negative for rash.  Neurological: Positive for weakness. Negative for dizziness, speech change, focal weakness and headaches.  Endo/Heme/Allergies: Does not bruise/bleed easily.  Psychiatric/Behavioral: Negative for depression.    DRUG ALLERGIES:  No Known Allergies VITALS:  Blood pressure (!) 119/44, pulse 61, temperature 98 F (36.7 C), resp. rate 18, height 6' (1.829 m), weight 77.7 kg (171 lb 6.4 oz), SpO2 93 %. PHYSICAL EXAMINATION:  Physical Exam  Constitutional: He is oriented to person, place, and time and well-developed, well-nourished, and in no distress.  HENT:  Head: Normocephalic and atraumatic.  Eyes: Conjunctivae and EOM are normal. Pupils are equal, round, and reactive to light.  Neck: Normal range of motion. Neck supple. No tracheal deviation present. No thyromegaly present.  Cardiovascular: Normal rate, regular rhythm and normal heart sounds.  Pulmonary/Chest: Effort normal and breath sounds normal. No respiratory distress. He has no wheezes. He exhibits no tenderness.  Abdominal: Soft. Bowel sounds are  normal. He exhibits no distension. There is no tenderness.  Musculoskeletal: Normal range of motion. He exhibits edema.  Neurological: He is alert and oriented to person, place, and time. No cranial nerve deficit.  Skin: Skin is warm and dry. No rash noted.  Psychiatric: Mood and affect normal.   LABORATORY PANEL:  Male CBC Recent Labs  Lab 03/09/17 0603  WBC 5.3  HGB 11.3*  HCT 34.4*  PLT 224   ------------------------------------------------------------------------------------------------------------------ Chemistries  Recent Labs  Lab 03/07/17 2002  03/09/17 0603  NA 135   < > 134*  K 3.8   < > 3.7  CL 102   < > 101  CO2 24   < > 22  GLUCOSE 107*   < > 83  BUN 22*   < > 25*  CREATININE 1.61*   < > 1.84*  CALCIUM 8.6*   < > 8.1*  AST 24  --   --   ALT 8*  --   --   ALKPHOS 204*  --   --   BILITOT 1.6*  --   --    < > = values in this interval not displayed.   RADIOLOGY:  No results found. ASSESSMENT AND PLAN:   * Acute hypoxic respiratory failure, secondary to acute CHF exacerbation: stop Lasix as creat going up  * Acute CHF exacerbation.  - Continue ASA, statin - Hold lisinopril and lasix due to worsening renal function - Neg 2.5 liters -  Echocardiogram shows EF 25-30%  still pending consult cardiology   * Hypothyroidism - TSH > 19 suggestive of under dose of synthroid. Will increase dose to 125 mcg  * Acute on CKD 3: creatinine 1.6 -> 1.84  - continue to  monitor kidney function closely and avoid nephrotoxic medications.  * Mild elevation in troponin level, chronic, likely due to demand ischemia.   * Generalized muscle weakness and unstable gait.   PT recommends HHPT       All the records are reviewed and case discussed with Care Management/Social Worker. Management plans discussed with the patient, nursing and they are in agreement.  CODE STATUS: Full Code  TOTAL TIME TAKING CARE OF THIS PATIENT: 35 minutes.   More than 50% of the time was  spent in counseling/coordination of care: YES  POSSIBLE D/C IN 1 DAYS, DEPENDING ON CLINICAL CONDITION.   Delfino Lovett M.D on 03/09/2017 at 8:46 PM  Between 7am to 6pm - Pager - [phone]  After 6pm go to [email or web address] - Social research officer, government  [facility]  Office  [phone]  CC: Primary care physician; Jaclyn Shaggy, MD  Note: This dictation was prepared with Dragon dictation along with smaller phrase technology. Any transcriptional errors that result from this process are unintentional.

## 2017-03-09 NOTE — Plan of Care (Signed)
  Progressing Clinical Measurements: Ability to maintain clinical measurements within normal limits will improve 03/09/2017 0241 - Progressing by Dorna LeitzNesbitt, Macon Sandiford M, RN Respiratory complications will improve 03/09/2017 0241 - Progressing by Dorna LeitzNesbitt, Juletta Berhe M, RN Elimination: Will not experience complications related to urinary retention 03/09/2017 0241 - Progressing by Dorna LeitzNesbitt, Jheri Mitter M, RN Safety: Ability to remain free from injury will improve 03/09/2017 0241 - Progressing by Dorna LeitzNesbitt, Alexandr Oehler M, RN Skin Integrity: Risk for impaired skin integrity will decrease 03/09/2017 0241 - Progressing by Dorna LeitzNesbitt, Cleta Heatley M, RN Cardiac: Ability to achieve and maintain adequate cardiopulmonary perfusion will improve 03/09/2017 0241 - Progressing by Dorna LeitzNesbitt, Ciria Bernardini M, RN

## 2017-03-09 NOTE — Progress Notes (Signed)
Physical Therapy Treatment Patient Details Name: [name] [name] MRN: [id number] DOB: Apr 02, 1945 Today's Date: 03/09/2017    History of Present Illness Pt is a71 y.o.malewith a known history of coronary artery disease,status post old MI,status post CABG in the past,hyperlipidemia,hypertension,peripheral vascular disease and venous insufficiency.    PT Comments    Pt presents with minor deficits in strength, transfers, mobility, gait, balance, and activity tolerance.  Pt requires extra time and effort during functional mobility tasks secondary to scrotal swelling and corresponding discomfort with movement.  Pt ambulated further this session but continues to be required to walk with very wide BOS secondary to scrotal swelling.  Pt's SpO2 remained >/= 97% on room air throughout session with HR in the low 60s at rest and high 60s with activity.  Pt is at risk for further functional decline and would benefit from HHPT services upon discharge to safely address above deficits for decreased caregiver assistance and eventual return to PLOF.     Follow Up Recommendations  Home health PT     Equipment Recommendations  Rolling walker with 5" wheels    Recommendations for Other Services       Precautions / Restrictions Precautions Precautions: Fall Restrictions Weight Bearing Restrictions: No    Mobility  Bed Mobility Overal bed mobility: Modified Independent             General bed mobility comments: Some extra time and effort during bed mobility tasks exacerbated by scrotal swelling  Transfers Overall transfer level: Needs assistance Equipment used: Rolling walker (2 wheeled) Transfers: Sit to/from Stand Sit to Stand: Supervision         General transfer comment: Good eccentric and concentric control during transfers without instability  Ambulation/Gait Ambulation/Gait assistance: Supervision Ambulation Distance (Feet): 40 Feet Assistive device: Rolling walker (2  wheeled) Gait Pattern/deviations: Step-through pattern;Decreased step length - right;Decreased step length - left;Wide base of support   Gait velocity interpretation: Below normal speed for age/gender General Gait Details: Cautious gait secondary to scrotal discomfort in standing with scrotal pain limiting amb distance more so than deficits in strength or activity tolerance.    Stairs            Wheelchair Mobility    Modified Rankin (Stroke Patients Only)       Balance Overall balance assessment: Needs assistance Sitting-balance support: Feet supported;No upper extremity supported Sitting balance-Leahy Scale: Normal     Standing balance support: Bilateral upper extremity supported Standing balance-Leahy Scale: Good                              Cognition Arousal/Alertness: Awake/alert Behavior During Therapy: WFL for tasks assessed/performed Overall Cognitive Status: Within Functional Limits for tasks assessed                                        Exercises Total Joint Exercises Ankle Circles/Pumps: AROM;Both;Strengthening;10 reps;5 reps Quad Sets: Strengthening;Both;10 reps Gluteal Sets: Strengthening;Both;10 reps Heel Slides: AROM;Both;10 reps Hip ABduction/ADduction: AROM;Both;10 reps Straight Leg Raises: AROM;Both;10 reps Long Arc Quad: AROM;Both;10 reps Knee Flexion: AROM;Both;10 reps Marching in Standing: AROM;Both;10 reps Other Exercises Other Exercises: HEP education and review for BLE LAQs, APs, GS, and QS x 10 each 5-6x/day    General Comments        Pertinent Vitals/Pain Pain Assessment: No/denies pain    Home Living  Prior Function            PT Goals (current goals can now be found in the care plan section) Progress towards PT goals: Progressing toward goals    Frequency    Min 2X/week      PT Plan Current plan remains appropriate    Co-evaluation               AM-PAC PT "6 Clicks" Daily Activity  Outcome Measure                   End of Session Equipment Utilized During Treatment: Gait belt Activity Tolerance: Patient tolerated treatment well Patient left: in chair;with call bell/phone within reach Nurse Communication: Mobility status PT Visit Diagnosis: Difficulty in walking, not elsewhere classified (R26.2);Muscle weakness (generalized) (M62.81)     Time: [id number] PT Time Calculation (min) (ACUTE ONLY): 26 min  Charges:  $Therapeutic Exercise: 8-22 mins $Therapeutic Activity: 8-22 mins                    G Codes:       Elly Modena. Scott Jasten Guyette PT, DPT 03/09/17, 1:03 PM

## 2017-03-09 NOTE — Plan of Care (Signed)
  Progressing Safety: Ability to remain free from injury will improve 03/09/2017 2332 - Progressing by Dorna LeitzNesbitt, Denali Becvar M, RN Skin Integrity: Risk for impaired skin integrity will decrease 03/09/2017 2332 - Progressing by Dorna LeitzNesbitt, Ayonna Speranza M, RN Cardiac: Ability to achieve and maintain adequate cardiopulmonary perfusion will improve 03/09/2017 2332 - Progressing by Dorna LeitzNesbitt, Phung Kotas M, RN

## 2017-03-10 ENCOUNTER — Inpatient Hospital Stay: Payer: Medicare HMO

## 2017-03-10 LAB — CBC
HEMATOCRIT: 33.3 % — AB (ref 40.0–52.0)
HEMOGLOBIN: 10.7 g/dL — AB (ref 13.0–18.0)
MCH: 26.1 pg (ref 26.0–34.0)
MCHC: 32.1 g/dL (ref 32.0–36.0)
MCV: 81.3 fL (ref 80.0–100.0)
Platelets: 227 10*3/uL (ref 150–440)
RBC: 4.1 MIL/uL — ABNORMAL LOW (ref 4.40–5.90)
RDW: 18.9 % — ABNORMAL HIGH (ref 11.5–14.5)
WBC: 5.8 10*3/uL (ref 3.8–10.6)

## 2017-03-10 LAB — GLUCOSE, CAPILLARY
GLUCOSE-CAPILLARY: 89 mg/dL (ref 65–99)
Glucose-Capillary: 103 mg/dL — ABNORMAL HIGH (ref 65–99)
Glucose-Capillary: 100 mg/dL — ABNORMAL HIGH (ref 65–99)

## 2017-03-10 LAB — BASIC METABOLIC PANEL
ANION GAP: 11 (ref 5–15)
BUN: 29 mg/dL — ABNORMAL HIGH (ref 6–20)
CHLORIDE: 100 mmol/L — AB (ref 101–111)
CO2: 22 mmol/L (ref 22–32)
Calcium: 8.4 mg/dL — ABNORMAL LOW (ref 8.9–10.3)
Creatinine, Ser: 1.86 mg/dL — ABNORMAL HIGH (ref 0.61–1.24)
GFR calc Af Amer: 40 mL/min — ABNORMAL LOW (ref >60)
GFR, EST NON AFRICAN AMERICAN: 35 mL/min — AB (ref >60)
Glucose, Bld: 76 mg/dL (ref 65–99)
POTASSIUM: 4.1 mmol/L (ref 3.5–5.1)
Sodium: 133 mmol/L — ABNORMAL LOW (ref 135–145)

## 2017-03-10 MED ORDER — ENSURE ENLIVE PO LIQD: 237.0000 mL | Freq: Two times a day (BID) | ORAL | Status: DC

## 2017-03-10 MED ORDER — IOPAMIDOL (ISOVUE-300) INJECTION 61%
15.0000 mL | INTRAVENOUS | Status: AC
  Administered 2017-03-10 (×2): 15 mL via ORAL

## 2017-03-10 NOTE — Consult Note (Signed)
Cardiology Consultation Note    Patient ID: [name], MRN: [id number], DOB/AGE: 05-10-1945 72 y.o. Admit date: 03/07/2017   Date of Consult: 03/10/2017 Primary Physician: Jaclyn Shaggy, MD Primary Cardiologist: none  Chief Complaint: peripheral edema Reason for Consultation: chf Requesting MD: Dr. Sherryll Burger  HPI: [name] [name] is a 72 y.o. male with history of ischemic cardiomyopathy ef 25-30%, c/p cabg in 2013. S/p mi, hyperlipidemia, hypertension who was admitted with progressive sob and worsening lower extremity edema. He is seen by vascular surgery and has unna boots in place. He admits to a high sodium diet. The lower extremity edema has increase up to his thighs and scrotum. CXR on admission showed cardiomegaly with vascular congestion. Has been diuresed since admission with increase in his serum creatinine somewhat from 1.54 on admission to 1.86. He feels better and has less scrotal edema but still is not quite back to baseline. He is currently on atorvastatin 20 mg daily, asa, . He has been taken off of ace I and diuretics due to renal funciton.   Past Medical History:  Diagnosis Date  . Coronary artery disease   . Hyperlipidemia   . Hypertension   . Hypothyroidism   . 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 Lady Gary thinks I've had 2 that I didn't know about"  . Peripheral vascular disease (HCC) 06/26/11   "blockages"  . Pneumonia 1950-1960's  . Shortness of breath 06/26/11   "all the time for a good while"      Surgical History:  Past Surgical History:  Procedure Laterality Date  . CARDIAC CATHETERIZATION  06/24/11  . CORONARY ARTERY BYPASS GRAFT  06/30/2011   Procedure: CORONARY ARTERY BYPASS GRAFTING (CABG);  Surgeon: Loreli Slot, MD;  Location: [facility] OR;  Service: Open Heart Surgery;  Laterality: N/A;  . TONSILLECTOMY     "as a kid"     Home Meds: Prior to Admission medications   Medication Sig Start Date End Date Taking? Authorizing Provider  aspirin EC 81  MG tablet Take 81 mg by mouth daily.   Yes [provider]  carvedilol (COREG) 3.125 MG tablet Take 1 tablet (3.125 mg total) by mouth 2 (two) times daily with a meal. 07/09/11 03/07/17 Yes Doree Fudge M, PA-C  furosemide (LASIX) 40 MG tablet Take 40 mg by mouth every morning. 12/29/16  Yes [provider]  levothyroxine (SYNTHROID, LEVOTHROID) 100 MCG tablet TAKE 1 TABLET BY MOUTH DAILY AND TAKE 1 AND 1/2 TABLETS ON WED AND SAT 12/06/16  Yes [provider]  lisinopril (PRINIVIL,ZESTRIL) 10 MG tablet Take 10 mg by mouth daily. 12/14/16  Yes [provider]  Rivaroxaban (XARELTO) 20 MG TABS Take 20 mg by mouth daily.   Yes [provider]  amoxicillin (AMOXIL) 500 MG capsule  02/02/17   [provider]  atorvastatin (LIPITOR) 20 MG tablet Take 20 mg by mouth at bedtime.    [provider]  HYDROcodone-acetaminophen (NORCO/VICODIN) 5-325 MG tablet Take 1 tablet by mouth every 4 (four) hours as needed for moderate pain. Patient not taking: Reported on 02/23/2017 02/26/15   Tommi Rumps, PA-C  sulfamethoxazole-trimethoprim (BACTRIM DS,SEPTRA DS) 800-160 MG tablet Take 1 tablet by mouth 2 (two) times daily. Patient not taking: Reported on 02/23/2017 02/26/15   Tommi Rumps, PA-C    Inpatient Medications:  . aspirin EC  81 mg Oral Daily  . atorvastatin  20 mg Oral QHS  . docusate sodium  100 mg Oral  BID  . enoxaparin (LOVENOX) injection  40 mg Subcutaneous Q24H  . levothyroxine  125 mcg Oral QAC breakfast  . mouth rinse  15 mL Mouth Rinse BID  . sodium chloride flush  3 mL Intravenous Q12H     Allergies: No Known Allergies  Social History   Socioeconomic History  . Marital status: Married    Spouse name: Not on file  . Number of children: 2  . Years of education: Not on file  . Highest education level: Not on file  Social Needs  . Financial resource strain: Not on file  . Food insecurity - worry: Not on file  .  Food insecurity - inability: Not on file  . Transportation needs - medical: Not on file  . Transportation needs - non-medical: Not on file  Occupational History  . Not on file  Tobacco Use  . Smoking status: Former Smoker    Packs/day: 1.00    Years: 50.00    Pack years: 50.00    Types: Cigarettes    Last attempt to quit: 06/23/2011    Years since quitting: 5.7  . Smokeless tobacco: Never Used  Substance and Sexual Activity  . Alcohol use: Yes    Comment: 06/26/11 "every once in awhile I'd drink a beer; that's about come to an end too"  . Drug use: No  . Sexual activity: Not Currently  Other Topics Concern  . Not on file  Social History Narrative   Lives in [place], [place] alone.      Family History  Problem Relation Age of Onset  . Heart disease Mother 77  . Hypertension Brother   . Arrhythmia Brother      Review of Systems: A 12-system review of systems was performed and is negative except as noted in the HPI.  Labs: Recent Labs    03/07/17 2002  TROPONINI 0.04*   Lab Results  Component Value Date   WBC 5.8 03/10/2017   HGB 10.7 (L) 03/10/2017   HCT 33.3 (L) 03/10/2017   MCV 81.3 03/10/2017   PLT 227 03/10/2017    Recent Labs  Lab 03/07/17 2002  03/10/17 0420  NA 135   < > 133*  K 3.8   < > 4.1  CL 102   < > 100*  CO2 24   < > 22  BUN 22*   < > 29*  CREATININE 1.61*   < > 1.86*  CALCIUM 8.6*   < > 8.4*  PROT 7.2  --   --   BILITOT 1.6*  --   --   ALKPHOS 204*  --   --   ALT 8*  --   --   AST 24  --   --   GLUCOSE 107*   < > 76   < > = values in this interval not displayed.   Lab Results  Component Value Date   CHOL 138 06/25/2011   HDL 26 (L) 06/25/2011   LDLCALC 98 06/25/2011   TRIG 69 06/25/2011   Lab Results  Component Value Date   DDIMER 3.65 (H) 06/30/2011    Radiology/Studies:  Dg Chest 1 View  Result Date: 03/07/2017 CLINICAL DATA:  Short of breath EXAM: CHEST 1 VIEW COMPARISON:  02/26/2015 FINDINGS: Post sternotomy changes. Increased  left pleural effusion. Cardiomegaly with vascular congestion. Development of a small right pleural effusion. Dense left basilar consolidation. Aortic atherosclerosis. No pneumothorax. IMPRESSION: 1. Small bilateral left greater than right pleural effusions increased compared to prior  2. Cardiomegaly with vascular congestion 3. Increasing atelectasis or pneumonia at the left lung base. Electronically Signed   By: Jasmine PangKim  Fujinaga M.D.   On: 03/07/2017 21:08    Wt Readings from Last 3 Encounters:  03/10/17 78.4 kg (172 lb 12.8 oz)  03/02/17 81.1 kg (178 lb 12.8 oz)  02/23/17 81.6 kg (180 lb)    EKG: nsr with rbbb  Physical Exam:  Blood pressure (!) 102/46, pulse 62, temperature 98.2 F (36.8 C), resp. rate 18, height 6' (1.829 m), weight 78.4 kg (172 lb 12.8 oz), SpO2 97 %. Body mass index is 23.44 kg/m. General: Well developed, well nourished, in no acute distress. Head: Normocephalic, atraumatic, sclera non-icteric, no xanthomas, nares are without discharge.  Neck: Negative for carotid bruits. JVD not elevated. Lungs: Clear bilaterally to auscultation without wheezes, rales, or rhonchi. Breathing is unlabored. Heart: RRR with S1 S2. No murmurs, rubs, or gallops appreciated. Abdomen: Soft, non-tender, non-distended with normoactive bowel sounds. No hepatomegaly. No rebound/guarding. No obvious abdominal masses. Msk:  Strength and tone appear normal for age. Extremities: 4+ edema from thighs down to to feet.  Neuro: Alert and oriented X 3. No facial asymmetry. No focal deficit. Moves all extremities spontaneously. Psych:  Responds to questions appropriately with a normal affect.     Assessment and Plan  72 yo with history of ischemic cardiomyopathy with ef of 25-30%, now admitted with peripheral edema and sob. Has unna boots in place with improvement in his lower extremities. Agree with holding diuretics for now. Will need to agressively avoid sodium in his diet. Continue with unna boots.  Stop all etoh and tobacco. Will need early outpatient follow up for additional chf treatent including careful diuresis and reattempt at afterload reduceion. Not a candidate for entresto at present.   Signed, Dalia HeadingKenneth A Annamay Laymon MD 03/10/2017, 8:31 AM Pager: [phone]

## 2017-03-10 NOTE — Progress Notes (Signed)
Occupational Therapy Treatment Patient Details Name: [name] [name] MRN: [id number] DOB: May 04, 1945 Today's Date: 03/10/2017    History of present illness Pt is a71 y.o.malewith a known history of coronary artery disease,status post old MI,status post CABG in the past,hyperlipidemia,hypertension,peripheral vascular disease and venous insufficiency.   OT comments  Pt seen for OT treatment this date. Pt resting upon arrival, wakes easily and agreeable to OT. Pt instructed in AE for LB dressing to minimize energy exertion and pt able to verbalize understanding of techniques for using AE for LB ADL tasks, declined to trial himself this date as he was attempting to get rest in order to stay up and watch tonight's [employer] basketball game. Pt educated in energy conservation strategies including home/routines modifications, activity pacing, work simplification, falls prevention, and DME/AE to maximize independence and minimize over exertion or exacerbation of symptoms. Pt notes he primarily eats out for most meals. OT instructed pt in strategies to utilize when eating out to decrease sodium intake. Pt verbalized understanding of education/training provided this date. Would continue to benefit from skilled OT services to maximize independence with mobility, ADL, and chronic disease mgt in order to minimize risk of readmission, falls, and need for increased assist.     Follow Up Recommendations  Home health OT    Equipment Recommendations  Other (comment)(reacher, sock aid)    Recommendations for Other Services      Precautions / Restrictions Precautions Precautions: Fall Restrictions Weight Bearing Restrictions: No       Mobility Bed Mobility                  Transfers                      Balance                                           ADL either performed or assessed with clinical judgement   ADL Overall ADL's : Needs assistance/impaired                     Lower Body Dressing: Set up;Minimal assistance;With adaptive equipment;Sit to/from stand                 General ADL Comments: Pt verbalizes techniques for using AE for LB ADL tasks, declined to trial himself this date as he was attempting to get rest in order to stay up and watch tonight's [employer] basketball game     Vision Patient Visual Report: No change from baseline     Perception     Praxis      Cognition Arousal/Alertness: Awake/alert Behavior During Therapy: WFL for tasks assessed/performed Overall Cognitive Status: Within Functional Limits for tasks assessed                                          Exercises Other Exercises Other Exercises: Pt educated in energy conservation strategies including home/routines modifications, activity pacing, work simplification, falls prevention, and DME/AE to maximize independence and minimize over exertion or exacerbation of symptoms. Other Exercises: Pt notes he primarily eats out for most meals. Pt provided with strategies to utilize when eating out to decrease sodium intake.    Shoulder Instructions       General Comments  Pertinent Vitals/ Pain       Pain Assessment: No/denies pain(pt notes discomfort but does not number in BLE due to swelling)  Home Living                                          Prior Functioning/Environment              Frequency  Min 1X/week        Progress Toward Goals  OT Goals(current goals can now be found in the care plan section)  Progress towards OT goals: Progressing toward goals  Acute Rehab OT Goals Patient Stated Goal: "to do more for myself and not need any help again" OT Goal Formulation: With patient Time For Goal Achievement: 03/22/17 Potential to Achieve Goals: Good  Plan Discharge plan remains appropriate;Frequency remains appropriate    Co-evaluation                 AM-PAC PT "6 Clicks"  Daily Activity     Outcome Measure   Help from another person eating meals?: None Help from another person taking care of personal grooming?: None Help from another person toileting, which includes using toliet, bedpan, or urinal?: A Little Help from another person bathing (including washing, rinsing, drying)?: A Little Help from another person to put on and taking off regular upper body clothing?: None Help from another person to put on and taking off regular lower body clothing?: A Little 6 Click Score: 21    End of Session    OT Visit Diagnosis: Unsteadiness on feet (R26.81);Pain;Muscle weakness (generalized) (M62.81) Pain - Right/Left: Right(bilateral) Pain - part of body: Leg   Activity Tolerance Patient tolerated treatment well   Patient Left in bed;with call bell/phone within reach;with bed alarm set   Nurse Communication          Time: [id number] OT Time Calculation (min): 13 min  Charges: OT General Charges $OT Visit: 1 Visit OT Treatments $Self Care/Home Management : 8-22 mins   Richrd PrimeJamie Stiller, MPH, MS, OTR/L ascom [phone] 03/10/17, 4:40 PM

## 2017-03-10 NOTE — Consult Note (Signed)
Date: 03/10/2017                  Patient Name:  [name] [name]  MRN: [id number]  DOB: 08/03/1945  Age / Sex: 72 y.o., male         PCP: Jaclyn Shaggy, MD                 Service Requesting Consult: IM/ Delfino Lovett, MD                 Reason for Consult: ARF, Anasarca            History of Present Illness: Patient is a 72 y.o. male with medical problems of CAD (CABG) Ch Sys CHF, Cirrhosis, PVD, who was admitted to [facility] on 03/07/2017 for evaluation of Scrotal swelling.  It was stopped because of rising creatinine Patient was placed on IV Lasix infusion however it was stopped for rising creatinine.  His creatinine increased to 1.86 CT scan of the abdomen done today showed hepatic cirrhosis, moderate ascites, bilateral pleural effusions, moderate cardiomegaly, diffuse mesenteric and body wall edema, cholelithiasis, enlarged prostate and chronic bladder outlet obstruction.  Medications: Outpatient medications: Medications Prior to Admission  Medication Sig Dispense Refill Last Dose  . aspirin EC 81 MG tablet Take 81 mg by mouth daily.   03/07/2017 at Unknown time  . carvedilol (COREG) 3.125 MG tablet Take 1 tablet (3.125 mg total) by mouth 2 (two) times daily with a meal. 60 tablet 1 unknown at unknown  . furosemide (LASIX) 40 MG tablet Take 40 mg by mouth every morning.  3 unknown at unknown  . levothyroxine (SYNTHROID, LEVOTHROID) 100 MCG tablet TAKE 1 TABLET BY MOUTH DAILY AND TAKE 1 AND 1/2 TABLETS ON WED AND SAT  5 unknown at unknown  . lisinopril (PRINIVIL,ZESTRIL) 10 MG tablet Take 10 mg by mouth daily.  5 unknown at unknown  . Rivaroxaban (XARELTO) 20 MG TABS Take 20 mg by mouth daily.   unknown at unknown  . amoxicillin (AMOXIL) 500 MG capsule    Completed Course at Unknown time  . atorvastatin (LIPITOR) 20 MG tablet Take 20 mg by mouth at bedtime.   Unknown at unknown  . HYDROcodone-acetaminophen (NORCO/VICODIN) 5-325 MG tablet Take 1 tablet by mouth every 4 (four) hours as  needed for moderate pain. (Patient not taking: Reported on 02/23/2017) 20 tablet 0 Not Taking at Unknown time  . sulfamethoxazole-trimethoprim (BACTRIM DS,SEPTRA DS) 800-160 MG tablet Take 1 tablet by mouth 2 (two) times daily. (Patient not taking: Reported on 02/23/2017) 20 tablet 0 Not Taking at Unknown time    Current medications: Current Facility-Administered Medications  Medication Dose Route Frequency Provider Last Rate Last Dose  . acetaminophen (TYLENOL) tablet 650 mg  650 mg Oral Q6H PRN Cammy Copa, MD       Or  . acetaminophen (TYLENOL) suppository 650 mg  650 mg Rectal Q6H PRN Cammy Copa, MD      . aspirin EC tablet 81 mg  81 mg Oral Daily Cammy Copa, MD   81 mg at 03/10/17 0911  . atorvastatin (LIPITOR) tablet 20 mg  20 mg Oral QHS Cammy Copa, MD   20 mg at 03/09/17 2038  . bisacodyl (DULCOLAX) EC tablet 5 mg  5 mg Oral Daily PRN Cammy Copa, MD      . diphenhydrAMINE (BENADRYL) tablet 25 mg  25 mg Oral Q6H PRN Angelina Ok D, MD   25 mg at 03/10/17 0911  . docusate  sodium (COLACE) capsule 100 mg  100 mg Oral BID Cammy Copa, MD   100 mg at 03/10/17 0911  . enoxaparin (LOVENOX) injection 40 mg  40 mg Subcutaneous Q24H Delfino Lovett, MD   40 mg at 03/09/17 2038  . hydrocortisone cream 1 %   Topical PRN Salary, Montell D, MD      . levothyroxine (SYNTHROID, LEVOTHROID) tablet 125 mcg  125 mcg Oral QAC breakfast Delfino Lovett, MD   125 mcg at 03/10/17 0911  . MEDLINE mouth rinse  15 mL Mouth Rinse BID Cammy Copa, MD      . ondansetron [facility]) tablet 4 mg  4 mg Oral Q6H PRN Cammy Copa, MD       Or  . ondansetron [facility]) injection 4 mg  4 mg Intravenous Q6H PRN Cammy Copa, MD      . sodium chloride flush (NS) 0.9 % injection 3 mL  3 mL Intravenous Q12H Delfino Lovett, MD   3 mL at 03/10/17 1205  . traZODone (DESYREL) tablet 25 mg  25 mg Oral QHS PRN Cammy Copa, MD          Allergies: No Known Allergies    Past Medical History: Past Medical History:   Diagnosis Date  . Coronary artery disease   . Hyperlipidemia   . Hypertension   . Hypothyroidism   . 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 Lady Gary thinks I've had 2 that I didn't know about"  . Peripheral vascular disease (HCC) 06/26/11   "blockages"  . Pneumonia 1950-1960's  . Shortness of breath 06/26/11   "all the time for a good while"     Past Surgical History: Past Surgical History:  Procedure Laterality Date  . CARDIAC CATHETERIZATION  06/24/11  . CORONARY ARTERY BYPASS GRAFT  06/30/2011   Procedure: CORONARY ARTERY BYPASS GRAFTING (CABG);  Surgeon: Loreli Slot, MD;  Location: [facility] OR;  Service: Open Heart Surgery;  Laterality: N/A;  . TONSILLECTOMY     "as a kid"     Family History: Family History  Problem Relation Age of Onset  . Heart disease Mother 40  . Hypertension Brother   . Arrhythmia Brother      Social History: Social History   Socioeconomic History  . Marital status: Married    Spouse name: Not on file  . Number of children: 2  . Years of education: Not on file  . Highest education level: Not on file  Social Needs  . Financial resource strain: Not on file  . Food insecurity - worry: Not on file  . Food insecurity - inability: Not on file  . Transportation needs - medical: Not on file  . Transportation needs - non-medical: Not on file  Occupational History  . Not on file  Tobacco Use  . Smoking status: Former Smoker    Packs/day: 1.00    Years: 50.00    Pack years: 50.00    Types: Cigarettes    Last attempt to quit: 06/23/2011    Years since quitting: 5.7  . Smokeless tobacco: Never Used  Substance and Sexual Activity  . Alcohol use: Yes    Comment: 06/26/11 "every once in awhile I'd drink a beer; that's about come to an end too"  . Drug use: No  . Sexual activity: Not Currently  Other Topics Concern  . Not on file  Social History Narrative   Lives in [place], [place] alone.      Review of Systems: Gen: no fever, chills  HEENT:  decreased hearing CV: no chest pain but significant edema Resp: no sob, cough or sputum GI: appetite is good GU : denies voiding problems MS: denies edema Derm:  rash, itching Psych: denies c/o Heme: no c/o Neuro: no c/o Endocrine. No c/o  Vital Signs: Blood pressure (!) 102/46, pulse 62, temperature 98.2 F (36.8 C), resp. rate 18, height 6' (1.829 m), weight 78.4 kg (172 lb 12.8 oz), SpO2 97 %.   Intake/Output Summary (Last 24 hours) at 03/10/2017 1646 Last data filed at 03/10/2017 1005 Gross per 24 hour  Intake 600 ml  Output 920 ml  Net -320 ml    Weight trends: Filed Weights   03/08/17 0600 03/09/17 0431 03/10/17 0425  Weight: 77.2 kg (170 lb 1.6 oz) 77.7 kg (171 lb 6.4 oz) 78.4 kg (172 lb 12.8 oz)    Physical Exam: General:  Laying in the bed, no acute distress  HEENT  anicteric, moist oral mucous membranes   Neck:  supple  Lungs:  clear b/l  Heart::  regular  Abdomen: Distended, scrotal edema  Extremities:  ++ edema  Neurologic: Alert, oreinted  Skin: Reddish, rash over arms, small peticheal, + itching               Lab results: Basic Metabolic Panel: Recent Labs  Lab 03/08/17 0417 03/09/17 0603 03/10/17 0420  NA 136 134* 133*  K 3.6 3.7 4.1  CL 104 101 100*  CO2 23 22 22   GLUCOSE 114* 83 76  BUN 23* 25* 29*  CREATININE 1.54* 1.84* 1.86*  CALCIUM 8.2* 8.1* 8.4*    Liver Function Tests: Recent Labs  Lab 03/07/17 2002  AST 24  ALT 8*  ALKPHOS 204*  BILITOT 1.6*  PROT 7.2  ALBUMIN 3.2*   No results for input(s): LIPASE, AMYLASE in the last 168 hours. No results for input(s): AMMONIA in the last 168 hours.  CBC: Recent Labs  Lab 03/07/17 2002  03/09/17 0603 03/10/17 0420  WBC 6.3   < > 5.3 5.8  NEUTROABS 4.9  --   --   --   HGB 12.3*   < > 11.3* 10.7*  HCT 37.4*   < > 34.4* 33.3*  MCV 82.1   < > 81.2 81.3  PLT 244   < > 224 227   < > = values in this interval not displayed.    Cardiac Enzymes: Recent Labs  Lab  03/07/17 2002  TROPONINI 0.04*    BNP: Invalid input(s): POCBNP  CBG: Recent Labs  Lab 03/08/17 0803 03/09/17 0758 03/10/17 0826 03/10/17 1207  GLUCAP 119* 79 103* 89    Microbiology: No results found for this or any previous visit (from the past 720 hour(s)).   Coagulation Studies: Recent Labs    03/07/17 2106  LABPROT 15.7*  INR 1.26    Urinalysis: Recent Labs    03/07/17 2106  COLORURINE YELLOW*  LABSPEC 1.014  PHURINE 5.0  GLUCOSEU NEGATIVE  HGBUR SMALL*  BILIRUBINUR NEGATIVE  KETONESUR NEGATIVE  PROTEINUR NEGATIVE  NITRITE NEGATIVE  LEUKOCYTESUR NEGATIVE        Imaging: Ct Abdomen Pelvis Wo Contrast  Result Date: 03/10/2017 CLINICAL DATA:  Abdominal pain and distention. Constipation. Fatigue. Ischemic cardiomyopathy. EXAM: CT ABDOMEN AND PELVIS WITHOUT CONTRAST TECHNIQUE: Multidetector CT imaging of the abdomen and pelvis was performed following the standard protocol without IV contrast. COMPARISON:  None. FINDINGS: Lower chest: Moderate cardiomegaly. Moderate right and large left pleural effusions are seen with compressive atelectasis in both lower  lobes. Hepatobiliary: Caudate lobe hypertrophy and mild capsular nodularity, consistent with hepatic cirrhosis. No liver mass identified on this noncontrast exam. Several calcified gallstones are seen, however there is no evidence of acute cholecystitis or biliary ductal dilatation Pancreas: No mass or inflammatory process visualized on this unenhanced exam. Spleen:  Within normal limits in size. Adrenals/Urinary tract: Extensive bilateral renal vascular calcification. No definite urolithiasis or evidence of hydronephrosis. Mild diffuse bladder wall thickening is likely due to chronic bladder outlet obstruction. A tiny amount of air in the urinary bladder is likely due to recent catheterization. Stomach/Bowel: No evidence of obstruction, inflammatory process, or abnormal fluid collections. Vascular/Lymphatic: No  pathologically enlarged lymph nodes identified. A 4.0 cm infrarenal abdominal aortic aneurysm is seen. Bilateral common iliac artery stents are again demonstrated. Aortic atherosclerosis. Reproductive:  Mildly enlarged prostate gland. Other:  Diffuse mesenteric and body wall edema.  Moderate ascites. Musculoskeletal: No suspicious bone lesions identified. Healing nondisplaced left posterior 10th and 11th rib fractures are noted, which appear subacute in age. IMPRESSION: Hepatic cirrhosis.  Moderate ascites. Bilateral pleural effusions and bibasilar atelectasis. Moderate cardiomegaly. Diffuse mesenteric and body wall edema. Cholelithiasis.  No radiographic evidence of cholecystitis. Mildly enlarged prostate gland, and findings of chronic bladder outlet obstruction. Healing subacute left posterior 10th and 11th rib fractures. 4.0 cm infrarenal abdominal aortic aneurysm. Recommend followup by ultrasound in 1 year. This recommendation follows ACR consensus guidelines: White Paper of the ACR Incidental Findings Committee II on Vascular Findings. J Am Coll Radiol 2013; 10:789-794. Electronically Signed   By: Myles Rosenthal M.D.   On: 03/10/2017 13:13      Assessment & Plan: Pt is a 72 y.o. caucsian  male with Ch Sys CHF, was admitted on 03/07/2017 with worsening abdominal swelling  Hospital stay is complicated by worsening creatinine after Lasix drip was started.  CT scan of the abdomen shows hepatic cirrhosis, ascites, pleural effusions, diffuse body wall edema/anasarca, enlarged prostate gland, findings of chronic bladder outlet obstruction, 4 cm infrarenal abdominal aortic aneurysm  1.  Acute renal failure Likely secondary to intravascular volume depletion and abnormal cardiorenal hemodynamics We obtained a bladder scan which showed 600 cc of residual urine.  Nurse inserted catheter but only 100 cc was obtained.  Patient refused placement of Foley catheter.  He does not found to follow-up with outpatient  neurologist.  He has catheters at home for intermittent self-catheterization which he has done in the past.  He prefers to do that if necessary. Recommend to maintain higher blood pressure for adequate renal perfusion At present, his blood pressure is under satisfactory control.  Avoid hypotension  2.  Ascites and anasarca Likely related to cirrhosis and chronic systolic congestive heart failure The diagnosis of cirrhosis is new for the patient.  He is overwhelmed by the number of medical problems he has We talked about low-salt diet.  Patient lives at home by himself.  He does not cope.  He eats out all the time.  Nevertheless, he will try some low-salt strategies.  At present, would like to hold off Lasix for now until serum creatinine starts to turn around Patient has furosemide at home.  Will likely continue that along with low-salt diet and follow. Would avoid ACE inhibitor at this time due to aggressive diuresis being done.     LOS: 3 Jaydan Chretien Thedore Mins 2/20/20194:46 [facility] [place], [place] [phone]  Note: This note was prepared with Dragon dictation. Any transcription errors are unintentional

## 2017-03-10 NOTE — Progress Notes (Signed)
[facility] [place] [facility]   PATIENT NAME: [name] [name]    MR#:  [id number]  DATE OF BIRTH:  05/11/1945  SUBJECTIVE:  CHIEF COMPLAINT:   Chief Complaint  Patient presents with  . Groin Swelling  . Abdominal Pain  scrotum swollen, some rash and itching - Lasix?, neg 3 liters REVIEW OF SYSTEMS:  Review of Systems  Constitutional: Positive for malaise/fatigue. Negative for chills, fever and weight loss.  HENT: Negative for nosebleeds and sore throat.   Eyes: Negative for blurred vision.  Respiratory: Positive for shortness of breath. Negative for cough and wheezing.   Cardiovascular: Positive for leg swelling. Negative for chest pain, orthopnea and PND.  Gastrointestinal: Positive for constipation. Negative for abdominal pain, diarrhea, heartburn, nausea and vomiting.  Genitourinary: Negative for dysuria and urgency.  Musculoskeletal: Negative for back pain.  Skin: Negative for rash.  Neurological: Positive for weakness. Negative for dizziness, speech change, focal weakness and headaches.  Endo/Heme/Allergies: Does not bruise/bleed easily.  Psychiatric/Behavioral: Negative for depression.   DRUG ALLERGIES:  No Known Allergies VITALS:  Blood pressure (!) 102/46, pulse 62, temperature 98.2 F (36.8 C), resp. rate 18, height 6' (1.829 m), weight 78.4 kg (172 lb 12.8 oz), SpO2 97 %. PHYSICAL EXAMINATION:  Physical Exam  Constitutional: He is oriented to person, place, and time and well-developed, well-nourished, and in no distress.  HENT:  Head: Normocephalic and atraumatic.  Eyes: Conjunctivae and EOM are normal. Pupils are equal, round, and reactive to light.  Neck: Normal range of motion. Neck supple. No tracheal deviation present. No thyromegaly present.  Cardiovascular: Normal rate, regular rhythm and normal heart sounds.  Pulmonary/Chest: Effort normal and breath sounds normal. No respiratory distress. He has no wheezes. He exhibits no  tenderness.  Abdominal: Soft. Bowel sounds are normal. He exhibits no distension. There is no tenderness.  Genitourinary:  Genitourinary Comments: Scrotal swelling  Musculoskeletal: Normal range of motion. He exhibits edema.  Neurological: He is alert and oriented to person, place, and time. No cranial nerve deficit.  Skin: Skin is warm and dry. No rash noted.  Psychiatric: Mood and affect normal.   LABORATORY PANEL:  Male CBC Recent Labs  Lab 03/10/17 0420  WBC 5.8  HGB 10.7*  HCT 33.3*  PLT 227   ------------------------------------------------------------------------------------------------------------------ Chemistries  Recent Labs  Lab 03/07/17 2002  03/10/17 0420  NA 135   < > 133*  K 3.8   < > 4.1  CL 102   < > 100*  CO2 24   < > 22  GLUCOSE 107*   < > 76  BUN 22*   < > 29*  CREATININE 1.61*   < > 1.86*  CALCIUM 8.6*   < > 8.4*  AST 24  --   --   ALT 8*  --   --   ALKPHOS 204*  --   --   BILITOT 1.6*  --   --    < > = values in this interval not displayed.   RADIOLOGY:  No results found. ASSESSMENT AND PLAN:   * Acute hypoxic respiratory failure, secondary to acute CHF exacerbation: stop Lasix as creat going up  * Drug rash - may be from Lasix (sulfa Allergy?) - lasix stopped  * Acute CHF exacerbation.  - Continue ASA, statin - Hold lisinopril and lasix due to worsening renal function - Neg 3 liters -  Echocardiogram shows EF 25-30%   - Appreciate cardiology input  * Hypothyroidism - TSH >  19 suggestive of under dose of synthroid. increased dose to 125 mcg  * Acute on CKD 3: creatinine 1.6 -> 1.84 -> 1.86 - continue to monitor kidney function closely and avoid nephrotoxic medications. - nephro c/s - d/w Dr Thedore Mins  * Mild elevation in troponin level, chronic, due to demand ischemia.   * Generalized muscle weakness and unstable gait.   PT recommends HHPT       All the records are reviewed and case discussed with Care Management/Social  Worker. Management plans discussed with the patient, nursing, Dr Thedore Mins and they are in agreement.  CODE STATUS: Full Code  TOTAL TIME TAKING CARE OF THIS PATIENT: 35 minutes.   More than 50% of the time was spent in counseling/coordination of care: YES  POSSIBLE D/C IN 1 DAYS, DEPENDING ON CLINICAL CONDITION.   Delfino Lovett M.D on 03/10/2017 at 11:30 AM  Between 7am to 6pm - Pager - [phone]  After 6pm go to [email or web address] - Social research officer, government  [facility]  Office  [phone]  CC: Primary care physician; Jaclyn Shaggy, MD  Note: This dictation was prepared with Dragon dictation along with smaller phrase technology. Any transcriptional errors that result from this process are unintentional.

## 2017-03-10 NOTE — Progress Notes (Addendum)
Bladder scan - MD made aware/ orders given / will monitor

## 2017-03-10 NOTE — Progress Notes (Signed)
In and O cath as ordered/ only 100mls  Drained/ tolerated well/ post In and out cath - bladder scan 820 mls/ MD Thedore MinsSingh made aware/  Stated bladder scan could be picking up edema/ continue to monitor and will attempt to In and out cath in the morning/ pt is refusing indwelling cath/ pt informed to let staff know if having s/s of discomfort.

## 2017-03-10 NOTE — Care Management (Signed)
Patient admitted with CHF with significant anasarca.  He lives alone and independent in all his adls. CM began discussing home health options and [facility] and stated he has had too much information thrown at him and it is difficult to take it all in.  He becomes almost tearful.  CM ended discussion and provided emotional support.

## 2017-03-11 LAB — GLUCOSE, CAPILLARY: Glucose-Capillary: 81 mg/dL (ref 65–99)

## 2017-03-11 LAB — BASIC METABOLIC PANEL
Anion gap: 10 (ref 5–15)
BUN: 32 mg/dL — ABNORMAL HIGH (ref 6–20)
CHLORIDE: 100 mmol/L — AB (ref 101–111)
CO2: 23 mmol/L (ref 22–32)
CREATININE: 1.88 mg/dL — AB (ref 0.61–1.24)
Calcium: 8.4 mg/dL — ABNORMAL LOW (ref 8.9–10.3)
GFR calc Af Amer: 40 mL/min — ABNORMAL LOW (ref >60)
GFR calc non Af Amer: 34 mL/min — ABNORMAL LOW (ref >60)
GLUCOSE: 80 mg/dL (ref 65–99)
Potassium: 4.5 mmol/L (ref 3.5–5.1)
Sodium: 133 mmol/L — ABNORMAL LOW (ref 135–145)

## 2017-03-11 LAB — CBC
HEMATOCRIT: 34.5 % — AB (ref 40.0–52.0)
Hemoglobin: 11.2 g/dL — ABNORMAL LOW (ref 13.0–18.0)
MCH: 26.5 pg (ref 26.0–34.0)
MCHC: 32.6 g/dL (ref 32.0–36.0)
MCV: 81.3 fL (ref 80.0–100.0)
Platelets: 247 10*3/uL (ref 150–440)
RBC: 4.24 MIL/uL — ABNORMAL LOW (ref 4.40–5.90)
RDW: 18.7 % — AB (ref 11.5–14.5)
WBC: 5.7 10*3/uL (ref 3.8–10.6)

## 2017-03-11 MED ORDER — TAMSULOSIN HCL 0.4 MG PO CAPS: 0.4000 mg | ORAL_CAPSULE | Freq: Every day | ORAL | 0 refills | Status: DC

## 2017-03-11 MED ORDER — TAMSULOSIN HCL 0.4 MG PO CAPS
0.4000 mg | ORAL_CAPSULE | Freq: Every day | ORAL | Status: DC
  Administered 2017-03-11: 0.4 mg via ORAL
  Filled 2017-03-11: qty 1

## 2017-03-11 MED ORDER — CARVEDILOL 3.125 MG PO TABS: 3.1250 mg | ORAL_TABLET | Freq: Two times a day (BID) | ORAL | 1 refills | Status: AC

## 2017-03-11 MED ORDER — LEVOTHYROXINE SODIUM 125 MCG PO TABS: 125.0000 ug | ORAL_TABLET | Freq: Every day | ORAL | 0 refills | Status: AC

## 2017-03-11 MED ORDER — FUROSEMIDE 40 MG PO TABS: 40.0000 mg | ORAL_TABLET | Freq: Two times a day (BID) | ORAL | 0 refills | Status: AC

## 2017-03-11 NOTE — Care Management (Signed)
Patient for discharge home today.  CM met with patient for a second time to discuss home health nurse follow up.  He declines and CM unable to persuade patient to accept service.  He denies he will have any issues obtaining his medications and keeping MD follow up appointments.  His brother will transport him home

## 2017-03-11 NOTE — Care Management Important Message (Signed)
Important Message  Patient Details  Name: [name] [name] MRN: [id number] Date of Birth: July 30, 1945   Medicare Important Message Given:  Yes  Signed IM notice given   Eber HongGreene, Shaolin Armas R, RN 03/11/2017, 1:56 PM

## 2017-03-11 NOTE — Discharge Instructions (Signed)
Scrotal Swelling Scrotal swelling may occur on one or both sides of the scrotum. Pain may also occur with swelling. Possible causes of scrotal swelling include:  Injury.  Infection.  An ingrown hair or abrasion in the area.  Repeated rubbing from tight-fitting underwear.  Poor hygiene.  A weakened area in the muscles around the groin (hernia). A hernia can allow abdominal contents to push into the scrotum.  Fluid around the testicle (hydrocele).  Enlarged vein around the testicle (varicocele).  Certain medical treatments or existing conditions.  A recent genital surgery or procedure.  The spermatic cord becomes twisted in the scrotum, which cuts off blood supply (testicular torsion).  Testicular cancer.  Follow these instructions at home: Once the cause of your scrotal swelling has been determined, you may be asked to monitor your scrotum for any changes. The following actions may help to alleviate any discomfort you are experiencing:  Rest and limit activity until the swelling goes away. Lying down is the preferred position.  Put ice on the scrotum: ? Put ice in a plastic bag. ? Place a towel between your skin and the bag. ? Leave the ice on for 20 minutes, 2-3 times a day for 1-2 days.  Place a rolled towel under the testicles for support.  Wear loose-fitting clothing or an athletic support cup for comfort.  Take all medicines as directed by your health care provider.  Perform a monthly self-exam of the scrotum and penis. Feel for changes. Ask your health care provider how to perform a monthly self-exam if you are unsure.  Contact a health care provider if:  You have a sudden (acute) onset of pain that is persistent and not improving.  You notice a heavy feeling or fluid in the scrotum.  You have pain or burning while urinating.  You have blood in the urine or semen.  You feel a lump around the testicle.  You notice that one testicle is larger than the other  (slight variation is normal).  You have a persistent dull ache or pain in the groin or scrotum. Get help right away if:  The pain does not go away or becomes severe.  You have a fever or shaking chills.  You have pain or vomiting that cannot be controlled.  You notice significant redness or swelling of one or both sides of the scrotum.  You experience redness spreading upward from your scrotum to your abdomen or downward from your scrotum to your thighs. This information is not intended to replace advice given to you by your health care provider. Make sure you discuss any questions you have with your health care provider. Document Released: 02/07/2010 Document Revised: 07/26/2015 Document Reviewed: 06/09/2012 Elsevier Interactive Patient Education  2018 Elsevier Inc.  

## 2017-03-11 NOTE — Progress Notes (Signed)
Discharge instructions explained to pt/ expressed the importance of his follow up apt/ verbalized an understanding/ iv removed/ RX and home meds returned to pt/ transported off unit via wheelchair.

## 2017-03-11 NOTE — Plan of Care (Signed)
Encourage patient to ambulate in room and hallway. Patient is very edematous.

## 2017-03-11 NOTE — Progress Notes (Signed)
[facility], [place] 03/11/17  Subjective:   Patient is doing fair Denies acute c/o Continues to have LE edema and ascites  Objective:  Vital signs in last 24 hours:  Temp:  [97.5 F (36.4 C)-98.4 F (36.9 C)] 97.9 F (36.6 C) (02/21 0401) Pulse Rate:  [63-66] 65 (02/21 0401) Resp:  [18] 18 (02/21 0401) BP: (115-152)/(48-60) 152/60 (02/21 0401) SpO2:  [96 %-99 %] 96 % (02/21 0401) Weight:  [78.9 kg (173 lb 14.4 oz)] 78.9 kg (173 lb 14.4 oz) (02/21 0401)  Weight change: 0.499 kg (1 lb 1.6 oz) Filed Weights   03/09/17 0431 03/10/17 0425 03/11/17 0401  Weight: 77.7 kg (171 lb 6.4 oz) 78.4 kg (172 lb 12.8 oz) 78.9 kg (173 lb 14.4 oz)    Intake/Output:    Intake/Output Summary (Last 24 hours) at 03/11/2017 1005 Last data filed at 03/11/2017 0950 Gross per 24 hour  Intake 360 ml  Output 720 ml  Net -360 ml     Physical Exam: General: NAD, laying in bed  HEENT Moist oral mucus membranes  Neck supple  Pulm/lungs rooma air, clear to auscultation  CVS/Heart Regular no rub  Abdomen:  Distended, non tender  Extremities: ++ tight edema  Neurologic: Alert oreinted  Skin: Warm, dry          Basic Metabolic Panel:  Recent Labs  Lab 03/07/17 2002 03/08/17 0417 03/09/17 0603 03/10/17 0420 03/11/17 0407  NA 135 136 134* 133* 133*  K 3.8 3.6 3.7 4.1 4.5  CL 102 104 101 100* 100*  CO2 24 23 22 22 23   GLUCOSE 107* 114* 83 76 80  BUN 22* 23* 25* 29* 32*  CREATININE 1.61* 1.54* 1.84* 1.86* 1.88*  CALCIUM 8.6* 8.2* 8.1* 8.4* 8.4*     CBC: Recent Labs  Lab 03/07/17 2002 03/08/17 0417 03/09/17 0603 03/10/17 0420 03/11/17 0407  WBC 6.3 6.2 5.3 5.8 5.7  NEUTROABS 4.9  --   --   --   --   HGB 12.3* 11.3* 11.3* 10.7* 11.2*  HCT 37.4* 34.4* 34.4* 33.3* 34.5*  MCV 82.1 81.8 81.2 81.3 81.3  PLT 244 235 224 227 247     No results found for: HEPBSAG, HEPBSAB, HEPBIGM    Microbiology:  No results found for this or any previous visit  (from the past 240 hour(s)).  Coagulation Studies: No results for input(s): LABPROT, INR in the last 72 hours.  Urinalysis: No results for input(s): COLORURINE, LABSPEC, PHURINE, GLUCOSEU, HGBUR, BILIRUBINUR, KETONESUR, PROTEINUR, UROBILINOGEN, NITRITE, LEUKOCYTESUR in the last 72 hours.  Invalid input(s): APPERANCEUR    Imaging: Ct Abdomen Pelvis Wo Contrast  Result Date: 03/10/2017 CLINICAL DATA:  Abdominal pain and distention. Constipation. Fatigue. Ischemic cardiomyopathy. EXAM: CT ABDOMEN AND PELVIS WITHOUT CONTRAST TECHNIQUE: Multidetector CT imaging of the abdomen and pelvis was performed following the standard protocol without IV contrast. COMPARISON:  None. FINDINGS: Lower chest: Moderate cardiomegaly. Moderate right and large left pleural effusions are seen with compressive atelectasis in both lower lobes. Hepatobiliary: Caudate lobe hypertrophy and mild capsular nodularity, consistent with hepatic cirrhosis. No liver mass identified on this noncontrast exam. Several calcified gallstones are seen, however there is no evidence of acute cholecystitis or biliary ductal dilatation Pancreas: No mass or inflammatory process visualized on this unenhanced exam. Spleen:  Within normal limits in size. Adrenals/Urinary tract: Extensive bilateral renal vascular calcification. No definite urolithiasis or evidence of hydronephrosis. Mild diffuse bladder wall thickening is likely due to chronic bladder outlet obstruction. A tiny  amount of air in the urinary bladder is likely due to recent catheterization. Stomach/Bowel: No evidence of obstruction, inflammatory process, or abnormal fluid collections. Vascular/Lymphatic: No pathologically enlarged lymph nodes identified. A 4.0 cm infrarenal abdominal aortic aneurysm is seen. Bilateral common iliac artery stents are again demonstrated. Aortic atherosclerosis. Reproductive:  Mildly enlarged prostate gland. Other:  Diffuse mesenteric and body wall edema.   Moderate ascites. Musculoskeletal: No suspicious bone lesions identified. Healing nondisplaced left posterior 10th and 11th rib fractures are noted, which appear subacute in age. IMPRESSION: Hepatic cirrhosis.  Moderate ascites. Bilateral pleural effusions and bibasilar atelectasis. Moderate cardiomegaly. Diffuse mesenteric and body wall edema. Cholelithiasis.  No radiographic evidence of cholecystitis. Mildly enlarged prostate gland, and findings of chronic bladder outlet obstruction. Healing subacute left posterior 10th and 11th rib fractures. 4.0 cm infrarenal abdominal aortic aneurysm. Recommend followup by ultrasound in 1 year. This recommendation follows ACR consensus guidelines: White Paper of the ACR Incidental Findings Committee II on Vascular Findings. J Am Coll Radiol 2013; 10:789-794. Electronically Signed   By: Myles RosenthalJohn  Stahl M.D.   On: 03/10/2017 13:13     Medications:    . aspirin EC  81 mg Oral Daily  . atorvastatin  20 mg Oral QHS  . docusate sodium  100 mg Oral BID  . enoxaparin (LOVENOX) injection  40 mg Subcutaneous Q24H  . feeding supplement (ENSURE ENLIVE)  237 mL Oral BID BM  . levothyroxine  125 mcg Oral QAC breakfast  . mouth rinse  15 mL Mouth Rinse BID  . sodium chloride flush  3 mL Intravenous Q12H   acetaminophen **OR** acetaminophen, bisacodyl, diphenhydrAMINE, hydrocortisone cream, ondansetron **OR** ondansetron (ZOFRAN) IV, traZODone  Assessment/ Plan:  72 y.o.caucasian  male with Ch Sys CHF, was admitted on 03/07/2017 with worsening abdominal swelling He has Severe peripheral vascular disease, cirrhosis, ascites, pleural effusions, diffuse body wall edema/anasarca, enlarged prostate gland, findings of chronic bladder outlet obstruction, 4 cm infrarenal abdominal aortic aneurysm  1. ARF  Likely from aggressive diuresis and volume shifts S Creatinine is now stable and expected to improve  2. BPH and Chronic Bladder outlet obstruction Bladder scan showed > 600 cc  but In and out cath revealed very small amount of urine. It was probably picking up ascites Patient has seen Dr Evelene CroonWolff in the past Start flomax  3. Ascites and Anasarca - Likely related to cirrhosis and chronic systolic congestive heart failure - f/u with CHF clinic - strict low salt diet Lasix 40 mg PO BID at home  Some concern about compliance with follow up as patient is hesitating to schedule f/u with various physicians    LOS: 4 Chloris Marcoux Thedore MinsSingh 2/21/201910:05 AM  [facility] [place], [place] [phone]  Note: This note was prepared with Dragon dictation. Any transcription errors are unintentional

## 2017-03-16 ENCOUNTER — Encounter (INDEPENDENT_AMBULATORY_CARE_PROVIDER_SITE_OTHER): Payer: Self-pay

## 2017-03-16 ENCOUNTER — Ambulatory Visit (INDEPENDENT_AMBULATORY_CARE_PROVIDER_SITE_OTHER): Payer: Medicare HMO | Admitting: Vascular Surgery

## 2017-03-16 VITALS — BP 141/82 | HR 77 | Resp 17 | Ht 70.0 in | Wt 170.0 lb

## 2017-03-16 DIAGNOSIS — R600 Localized edema: Secondary | ICD-10-CM

## 2017-03-16 NOTE — Progress Notes (Signed)
History of Present Illness  There is no documented history at this time  Assessments & Plan   There are no diagnoses linked to this encounter.    Additional instructions  Subjective:  Patient presents with venous ulcer of the Bilateral lower extremity.    Procedure:  3 layer unna wrap was placed Bilateral lower extremity.   Plan:   Follow up in one week.  

## 2017-03-16 NOTE — Progress Notes (Signed)
Patient ID: [name] [name], male    DOB: 03-Jul-1945, 72 y.o.   MRN: [id number]  HPI  Mr Furio is a 72 y/o male with a history of CAD, hyperlipidemia, cirrhosis, HTN, CKD, thyroid disease, PAD, PVD, current sporadic smoker and chronic heart failure.   Echo report from 03/08/17 reviewed and showed an EF of 25-30% along with a PA pressure 45 mm Hg.   Admitted 03/07/17 due to acute respiratory failure due to HF exacerbation. IV furosemide initially given with a net loss of ~3L. Furosemide had to be stopped due to worsening renal function. Cardiology and nephrology consults were obtained. Mild elevated in troponin thought to be due to demand ischemia. Discharged after 4 days with resumption of lasix.   He presents today for his initial visit with a chief complaint of moderate fatigue upon minimal exertion. This has been present for several years with varying levels of severity. He has associated shortness of breath, light-headedness, edema, rhinorrhea, abdominal distention, scrotal swelling and difficulty sleeping due to getting up frequently to urinate. He denies any chest pain, cough, palpitations or depression. Does admit to feeling overwhelmed due to recent health issues (HF, UNNA boots, cirrhosis).  Past Medical History:  Diagnosis Date  . CHF (congestive heart failure) (HCC)   . Chronic kidney disease   . Cirrhosis (HCC)   . Coronary artery disease   . Hyperlipidemia   . Hypertension   . Hypothyroidism   . 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 Lady Gary thinks I've had 2 that I didn't know about"  . Peripheral vascular disease (HCC) 06/26/11   "blockages"  . Pneumonia 1950-1960's  . Shortness of breath 06/26/11   "all the time for a good while"   Past Surgical History:  Procedure Laterality Date  . CARDIAC CATHETERIZATION  06/24/11  . CORONARY ARTERY BYPASS GRAFT  06/30/2011   Procedure: CORONARY ARTERY BYPASS GRAFTING (CABG);  Surgeon: Loreli Slot, MD;  Location: [facility] OR;   Service: Open Heart Surgery;  Laterality: N/A;  . TONSILLECTOMY     "as a kid"   Family History  Problem Relation Age of Onset  . Heart disease Mother 89  . Hypertension Brother   . Arrhythmia Brother    Social History   Tobacco Use  . Smoking status: Light Tobacco Smoker    Packs/day: 1.00    Years: 50.00    Pack years: 50.00    Types: Cigarettes    Last attempt to quit: 06/23/2011    Years since quitting: 5.7  . Smokeless tobacco: Never Used  Substance Use Topics  . Alcohol use: Yes    Comment: 06/26/11 "every once in awhile I'd drink a beer; that's about come to an end too"   No Known Allergies Prior to Admission medications   Medication Sig Start Date End Date Taking? Authorizing Provider  aspirin EC 81 MG tablet Take 81 mg by mouth daily.   Yes [provider]  atorvastatin (LIPITOR) 40 MG tablet Take 40 mg by mouth daily. 03/12/17  Yes [provider]  carvedilol (COREG) 3.125 MG tablet Take 1 tablet (3.125 mg total) by mouth 2 (two) times daily with a meal. 03/11/17  Yes Delfino Lovett, MD  furosemide (LASIX) 40 MG tablet Take 1 tablet (40 mg total) by mouth 2 (two) times daily. 03/11/17  Yes Delfino Lovett, MD  levothyroxine (SYNTHROID, LEVOTHROID) 125 MCG tablet Take 1 tablet (125 mcg total) by mouth daily before breakfast. 03/12/17  Yes Delfino Lovett, MD  lisinopril (PRINIVIL,ZESTRIL) 10 MG tablet Take 10 mg by mouth daily. 12/14/16  Yes [provider]  tamsulosin (FLOMAX) 0.4 MG CAPS capsule Take 1 capsule (0.4 mg total) by mouth daily. 03/12/17  Yes Delfino LovettShah, Vipul, MD   Review of Systems  Constitutional: Positive for fatigue. Negative for appetite change.  HENT: Positive for rhinorrhea. Negative for congestion and sore throat.   Eyes: Negative.   Respiratory: Positive for shortness of breath. Negative for chest tightness.   Cardiovascular: Positive for leg swelling (UNNA boots). Negative for chest pain and palpitations.  Gastrointestinal: Positive for  abdominal distention. Negative for abdominal pain.  Endocrine: Negative.   Genitourinary: Positive for scrotal swelling.  Musculoskeletal: Negative for back pain and neck pain.  Allergic/Immunologic: Negative.   Neurological: Positive for light-headedness (with sudden position changes). Negative for dizziness.  Hematological: Negative for adenopathy. Does not bruise/bleed easily.  Psychiatric/Behavioral: Positive for sleep disturbance (waking up to urinate). Negative for dysphoric mood. The patient is nervous/anxious.    Vitals:   03/18/17 0911  BP: (!) 138/48  Pulse: (!) 119  Resp: 18  SpO2: 100%  Weight: 170 lb 8 oz (77.3 kg)  Height: 6' (1.829 m)   Wt Readings from Last 3 Encounters:  03/18/17 170 lb 8 oz (77.3 kg)  03/16/17 170 lb (77.1 kg)  03/11/17 173 lb 14.4 oz (78.9 kg)   Lab Results  Component Value Date   CREATININE 1.88 (H) 03/11/2017   CREATININE 1.86 (H) 03/10/2017   CREATININE 1.84 (H) 03/09/2017   Physical Exam  Constitutional: He is oriented to person, place, and time. He appears well-developed and well-nourished.  HENT:  Head: Normocephalic and atraumatic.  Neck: Normal range of motion. Neck supple. No JVD present.  Cardiovascular: Regular rhythm. Tachycardia present.  Pulmonary/Chest: Effort normal. He has no wheezes. He has no rales.  Abdominal: He exhibits distension. There is no tenderness.  Genitourinary:  Genitourinary Comments: Scrotal swelling remains, per patient  Musculoskeletal: He exhibits edema (currently wrapped in UNNA boots bilaterally). He exhibits no tenderness.  Neurological: He is alert and oriented to person, place, and time.  Skin: Skin is warm and dry.  Psychiatric: His behavior is normal. Thought content normal. His mood appears anxious.  Nursing note and vitals reviewed.    Assessment & Plan:  1: Chronic heart failure with reduced ejection fraction- - NYHA class III - mildly fluid overloaded today - weighing daily at home.  Reviewed the importance of calling for an overnight weight gain of >2 pounds or a weekly weight gain of >5 pounds - not adding salt but prior to hospitalization was eating out "all the time". Now his kids are doing the grocery shopping and trying to find easily fixed healthy meals. Reviewed how to read food labels and the importance of keeping his daily sodium intake to 2000mg  daily. Written dietary information was given to him about this. - unsure of fluid intake and says that he doesn't drink much water. He says he was told to drink ~ 40 ounces daily. Reviewed the importance of keeping track of his fluid intake and to substitute water for some soda - furosemide had been increased to twice daily and he was taking the 2nd dose late in the evening which was keeping him up during the night. Takes the morning dose around 9am so explained that he could take the second dose early/mid afternoon - discussed possibly changing his diuretic to torsemide in the future if the increase in furosemide dose doesn't continue to work -  does have UNNA boots placed bilaterally and he does elevate his legs when sitting for long periods of time. Has recently cut off part of his UNNA boots as he says that they were too tight on his feet. Denies any rough edges. Encouraged him to call the vascular provider and let them know.  - BNP 03/07/17 was >4500.0 - patient reports receiving his flu vaccine for this season - PharmD went in and reviewed medications with the patient and son - saw cardiology (Fath) in the hospital and they have to call to make a hospital follow-up appointment - discussed possibly titrating up carvedilol (would also help with tachycardia) along with possibly changing lisinopril to entresto  2: HTN- - BP looks good today - sees PCP [name]) 04/07/17 and has lab work drawn 03/31/17 - BMP from 03/11/17 reviewed and showed sodium 133, potassium 4.5 and GFR 34 - following with nephrology Thedore Mins)  3: PAD- -  bilateral lower legs wrapped in UNNA boots - seeing vascular 03/24/17 and is also having carotid arteries looked at that day as well  4: Tobacco use- - had quit smoking years ago but has since resumed and says that he smokes 1 cigarette "every now and then" - complete cessation discussed for 3 minutes with him  Medication list was reviewed.  Return in 2 months or sooner for any questions/problems before then.

## 2017-03-18 ENCOUNTER — Ambulatory Visit: Payer: Medicare HMO | Attending: Family | Admitting: Family

## 2017-03-18 ENCOUNTER — Encounter: Payer: Self-pay | Admitting: Family

## 2017-03-18 VITALS — BP 138/48 | HR 119 | Resp 18 | Ht 72.0 in | Wt 170.5 lb

## 2017-03-18 DIAGNOSIS — Z720 Tobacco use: Secondary | ICD-10-CM

## 2017-03-18 DIAGNOSIS — Z79899 Other long term (current) drug therapy: Secondary | ICD-10-CM | POA: Insufficient documentation

## 2017-03-18 DIAGNOSIS — Z79890 Hormone replacement therapy (postmenopausal): Secondary | ICD-10-CM | POA: Diagnosis not present

## 2017-03-18 DIAGNOSIS — Z951 Presence of aortocoronary bypass graft: Secondary | ICD-10-CM | POA: Diagnosis not present

## 2017-03-18 DIAGNOSIS — Z7982 Long term (current) use of aspirin: Secondary | ICD-10-CM | POA: Diagnosis not present

## 2017-03-18 DIAGNOSIS — E039 Hypothyroidism, unspecified: Secondary | ICD-10-CM | POA: Insufficient documentation

## 2017-03-18 DIAGNOSIS — N189 Chronic kidney disease, unspecified: Secondary | ICD-10-CM | POA: Insufficient documentation

## 2017-03-18 DIAGNOSIS — K7460 Unspecified cirrhosis of liver: Secondary | ICD-10-CM | POA: Diagnosis not present

## 2017-03-18 DIAGNOSIS — I130 Hypertensive heart and chronic kidney disease with heart failure and stage 1 through stage 4 chronic kidney disease, or unspecified chronic kidney disease: Secondary | ICD-10-CM | POA: Insufficient documentation

## 2017-03-18 DIAGNOSIS — I2510 Atherosclerotic heart disease of native coronary artery without angina pectoris: Secondary | ICD-10-CM | POA: Insufficient documentation

## 2017-03-18 DIAGNOSIS — I252 Old myocardial infarction: Secondary | ICD-10-CM | POA: Diagnosis not present

## 2017-03-18 DIAGNOSIS — I5022 Chronic systolic (congestive) heart failure: Secondary | ICD-10-CM | POA: Insufficient documentation

## 2017-03-18 DIAGNOSIS — E785 Hyperlipidemia, unspecified: Secondary | ICD-10-CM | POA: Insufficient documentation

## 2017-03-18 DIAGNOSIS — F17210 Nicotine dependence, cigarettes, uncomplicated: Secondary | ICD-10-CM | POA: Insufficient documentation

## 2017-03-18 DIAGNOSIS — I739 Peripheral vascular disease, unspecified: Secondary | ICD-10-CM | POA: Insufficient documentation

## 2017-03-18 DIAGNOSIS — I10 Essential (primary) hypertension: Secondary | ICD-10-CM

## 2017-03-18 NOTE — Patient Instructions (Addendum)
Resume weighing daily and call for an overnight weight gain of > 2 pounds or a weekly weight gain of >5 pounds.  Drink between 40-60 ounces of fluid daily

## 2017-03-19 ENCOUNTER — Encounter (INDEPENDENT_AMBULATORY_CARE_PROVIDER_SITE_OTHER): Payer: Self-pay | Admitting: Vascular Surgery

## 2017-03-23 ENCOUNTER — Other Ambulatory Visit (INDEPENDENT_AMBULATORY_CARE_PROVIDER_SITE_OTHER): Payer: Self-pay | Admitting: Vascular Surgery

## 2017-03-23 DIAGNOSIS — I739 Peripheral vascular disease, unspecified: Secondary | ICD-10-CM

## 2017-03-23 DIAGNOSIS — I779 Disorder of arteries and arterioles, unspecified: Secondary | ICD-10-CM

## 2017-03-23 DIAGNOSIS — M7989 Other specified soft tissue disorders: Secondary | ICD-10-CM

## 2017-03-24 ENCOUNTER — Ambulatory Visit (INDEPENDENT_AMBULATORY_CARE_PROVIDER_SITE_OTHER): Payer: Medicare HMO

## 2017-03-24 ENCOUNTER — Ambulatory Visit (INDEPENDENT_AMBULATORY_CARE_PROVIDER_SITE_OTHER): Payer: Medicare HMO | Admitting: Vascular Surgery

## 2017-03-24 ENCOUNTER — Encounter (INDEPENDENT_AMBULATORY_CARE_PROVIDER_SITE_OTHER): Payer: Self-pay | Admitting: Vascular Surgery

## 2017-03-24 ENCOUNTER — Ambulatory Visit: Payer: Self-pay | Admitting: Urology

## 2017-03-24 VITALS — BP 125/61 | HR 61 | Resp 17 | Ht 71.0 in | Wt 171.0 lb

## 2017-03-24 DIAGNOSIS — M79604 Pain in right leg: Secondary | ICD-10-CM | POA: Diagnosis not present

## 2017-03-24 DIAGNOSIS — M7989 Other specified soft tissue disorders: Secondary | ICD-10-CM

## 2017-03-24 DIAGNOSIS — I779 Disorder of arteries and arterioles, unspecified: Secondary | ICD-10-CM

## 2017-03-24 DIAGNOSIS — Z720 Tobacco use: Secondary | ICD-10-CM

## 2017-03-24 DIAGNOSIS — E785 Hyperlipidemia, unspecified: Secondary | ICD-10-CM

## 2017-03-24 DIAGNOSIS — I739 Peripheral vascular disease, unspecified: Secondary | ICD-10-CM

## 2017-03-24 DIAGNOSIS — M79605 Pain in left leg: Secondary | ICD-10-CM

## 2017-03-24 DIAGNOSIS — I890 Lymphedema, not elsewhere classified: Secondary | ICD-10-CM | POA: Diagnosis not present

## 2017-03-24 NOTE — Progress Notes (Signed)
Subjective:    Patient ID: [name] [name], male    DOB: 1945-12-27, 72 y.o.   MRN: [id number] Chief Complaint  Patient presents with  . Follow-up    1 month unna check   The patient presents for a monthly unna boot therapy / carotid artery stenosis follow-up.  The patient has been treated with a month of three layer zinc oxide bilateral unna wraps for a lymphedema exacerbation.  The patient presents today with an improvement to the edema located in his legs.  The patient states that he has medical grade one compression stockings that he will be able to transition into today.  The patient underwent a bilateral lower extremity venous reflux study which was notable for no evidence of deep vein thrombosis or superficial vein thrombophlebitis.  There is no evidence of deep or superficial vein insufficiency.  The patient also underwent a bilateral carotid artery duplex exam which was notable for 80-99% stenosis of the proximal right ICA and 60-79% stenosis of the mid right ICA.  Left carotid artery stenosis 60-79% proximally and 40-59% mid.  Bilateral vertebral arteries are patent and antegrade.  Normal flow dynamics to the bilateral subclavian arteries.  The patient denies any amaurosis fugax or neurological symptoms.  The patient denies any fever, nausea or vomiting.   Review of Systems  Constitutional: Negative.   HENT: Negative.   Eyes: Negative.   Respiratory: Negative.   Cardiovascular: Positive for leg swelling.       Lymphedema Carotid artery stenosis  Gastrointestinal: Negative.   Endocrine: Negative.   Genitourinary: Negative.   Musculoskeletal: Negative.   Skin: Negative.   Allergic/Immunologic: Negative.   Neurological: Negative.   Hematological: Negative.   Psychiatric/Behavioral: Negative.       Objective:   Physical Exam  Constitutional: He is oriented to person, place, and time. He appears well-developed and well-nourished. No distress.  HENT:  Head: Normocephalic and  atraumatic.  Eyes: Conjunctivae are normal. Pupils are equal, round, and reactive to light.  Neck: Normal range of motion.  Bilateral bruit noted to the carotids  Cardiovascular: Normal rate, regular rhythm, normal heart sounds and intact distal pulses.  Pulses:      Radial pulses are 2+ on the right side, and 2+ on the left side.  Hard to palpate pedal pulses however the bilateral feet are warm  Pulmonary/Chest: Effort normal and breath sounds normal.  Musculoskeletal: Normal range of motion. He exhibits no edema.  Neurological: He is alert and oriented to person, place, and time.  Skin: Skin is warm and dry. He is not diaphoretic.  Psychiatric: He has a normal mood and affect. His behavior is normal. Judgment and thought content normal.  Vitals reviewed.  BP 125/61 (BP Location: Right Arm, Patient Position: Sitting)   Pulse 61   Resp 17   Ht 5\' 11"  (1.803 m)   Wt 171 lb (77.6 kg)   BMI 23.85 kg/m   Past Medical History:  Diagnosis Date  . CHF (congestive heart failure) (HCC)   . Chronic kidney disease   . Cirrhosis (HCC)   . Coronary artery disease   . Hyperlipidemia   . Hypertension   . Hypothyroidism   . 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 Lady Gary thinks I've had 2 that I didn't know about"  . Peripheral vascular disease (HCC) 06/26/11   "blockages"  . Pneumonia 1950-1960's  . Shortness of breath 06/26/11   "all the time for a good while"   Social History  Socioeconomic History  . Marital status: Married    Spouse name: Not on file  . Number of children: 2  . Years of education: Not on file  . Highest education level: Not on file  Social Needs  . Financial resource strain: Not on file  . Food insecurity - worry: Not on file  . Food insecurity - inability: Not on file  . Transportation needs - medical: Not on file  . Transportation needs - non-medical: Not on file  Occupational History  . Not on file  Tobacco Use  . Smoking status: Light Tobacco Smoker      Packs/day: 1.00    Years: 50.00    Pack years: 50.00    Types: Cigarettes    Last attempt to quit: 06/23/2011    Years since quitting: 5.7  . Smokeless tobacco: Never Used  Substance and Sexual Activity  . Alcohol use: Yes    Comment: 06/26/11 "every once in awhile I'd drink a beer; that's about come to an end too"  . Drug use: No  . Sexual activity: Not Currently  Other Topics Concern  . Not on file  Social History Narrative   Lives in [place], [place] alone.    Past Surgical History:  Procedure Laterality Date  . CARDIAC CATHETERIZATION  06/24/11  . CORONARY ARTERY BYPASS GRAFT  06/30/2011   Procedure: CORONARY ARTERY BYPASS GRAFTING (CABG);  Surgeon: Loreli Slot, MD;  Location: [facility] OR;  Service: Open Heart Surgery;  Laterality: N/A;  . TONSILLECTOMY     "as a kid"   Family History  Problem Relation Age of Onset  . Heart disease Mother 56  . Hypertension Brother   . Arrhythmia Brother    No Known Allergies     Assessment & Plan:  The patient presents for a monthly unna boot therapy / carotid artery stenosis follow-up.  The patient has been treated with a month of three layer zinc oxide bilateral unna wraps for a lymphedema exacerbation.  The patient presents today with an improvement to the edema located in his legs.  The patient states that he has medical grade one compression stockings that he will be able to transition into today.  The patient underwent a bilateral lower extremity venous reflux study which was notable for no evidence of deep vein thrombosis or superficial vein thrombophlebitis.  There is no evidence of deep or superficial vein insufficiency.  The patient also underwent a bilateral carotid artery duplex exam which was notable for 80-99% stenosis of the proximal right ICA and 60-79% stenosis of the mid right ICA.  Left carotid artery stenosis 60-79% proximally and 40-59% mid.  Bilateral vertebral arteries are patent and antegrade.  Normal flow dynamics to the  bilateral subclavian arteries.  The patient denies any amaurosis fugax or neurological symptoms.  The patient denies any fever, nausea or vomiting.  1. Bilateral carotid artery disease, unspecified type (HCC) - Stable Patient with worsening bilateral carotid artery stenosis when compared to the previous exam in 2014 Right carotid artery stenosis no 80-99% Recommend the patient undergo a CTA of the neck to assess the patient's anatomy and exact degree of stenosis in preparation for repair The patient understands the radiology department will contact him directly and that he should call our office after the CT to set up an appointment to review the results with the physician  - CT Angio Neck W/Cm &/Or Wo/Cm; Future  2. PAD (peripheral artery disease) (HCC) - Stable Patient with moderate peripheral artery  disease to the bilateral lower extremity with found on Stat ABI performed 1 month ago before placing Unna boots The patient is asymptomatic at this time The patient has multiple risk factors for atherosclerotic disease and this should repeated on a yearly basis  3. Tobacco abuse - Stable We had a discussion for approximately 10 minutes regarding the absolute need for smoking cessation due to the deleterious nature of tobacco on the vascular system. We discussed the tobacco use would diminish patency of any intervention, and likely significantly worsen progressio of disease. We discussed multiple agents for quitting including replacement therapy or medications to reduce cravings such as Chantix. The patient voices their understanding of the importance of smoking cessation.  4. Hyperlipidemia, unspecified hyperlipidemia type - Stable Encouraged good control as its slows the progression of atherosclerotic disease  5. Lymphedema - New Venous duplex today without any evidence of chronic venous insufficiency The patient has spent 1 month in bilateral Unna wraps with great improvement to his lower  extremity edema Recommend transitioning into medical grade 1 compression socks.  The patient states that he has a pair.  The patient should place them in the a.m. and remove them in the p.m. The patient should elevate his legs heart level or higher If the patient's edema is not controlled with conservative therapy we may consider adding a lymphedema pump in the future The patient expresses understanding  Current Outpatient Medications on File Prior to Visit  Medication Sig Dispense Refill  . aspirin EC 81 MG tablet Take 81 mg by mouth daily.    Marland Kitchen. atorvastatin (LIPITOR) 40 MG tablet Take 40 mg by mouth daily.  1  . carvedilol (COREG) 3.125 MG tablet Take 1 tablet (3.125 mg total) by mouth 2 (two) times daily with a meal. 60 tablet 1  . furosemide (LASIX) 40 MG tablet Take 1 tablet (40 mg total) by mouth 2 (two) times daily. 60 tablet 0  . levothyroxine (SYNTHROID, LEVOTHROID) 125 MCG tablet Take 1 tablet (125 mcg total) by mouth daily before breakfast. 30 tablet 0  . lisinopril (PRINIVIL,ZESTRIL) 10 MG tablet Take 10 mg by mouth daily.  5  . tamsulosin (FLOMAX) 0.4 MG CAPS capsule Take 1 capsule (0.4 mg total) by mouth daily. 30 capsule 0   No current facility-administered medications on file prior to visit.    There are no Patient Instructions on file for this visit. No Follow-up on file.  Amori Cooperman A Kimi Kroft, PA-C

## 2017-03-26 NOTE — Discharge Summary (Signed)
[facility] [place] at [facility]   PATIENT NAME: [name] [name]    MR#:  [id number]  DATE OF BIRTH:  18-Sep-1945  DATE OF ADMISSION:  03/07/2017   ADMITTING PHYSICIAN: Cammy Copa, MD  DATE OF DISCHARGE: 03/11/2017  4:08 PM  PRIMARY CARE PHYSICIAN: Jaclyn Shaggy, MD   ADMISSION DIAGNOSIS:  Acute respiratory failure with hypoxia (HCC) [J96.01] Congestive heart failure, unspecified HF chronicity, unspecified heart failure type (HCC) [I50.9] DISCHARGE DIAGNOSIS:  Active Problems:   * No active hospital problems. *  SECONDARY DIAGNOSIS:   Past Medical History:  Diagnosis Date  . CHF (congestive heart failure) (HCC)   . Chronic kidney disease   . Cirrhosis (HCC)   . Coronary artery disease   . Hyperlipidemia   . Hypertension   . Hypothyroidism   . 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 Lady Gary thinks I've had 2 that I didn't know about"  . Peripheral vascular disease (HCC) 06/26/11   "blockages"  . Pneumonia 1950-1960's  . Shortness of breath 06/26/11   "all the time for a good while"   HOSPITAL COURSE:   *Acute hypoxic respiratory failure,secondary to acute CHF exacerbation:   * Drug rash - could be sulfa  *Acute systolic CHF exacerbation. - Continue ASA,statin -  Echocardiogram shows EF 25-30%  * Hypothyroidism - TSH > 19 suggestive of under dose of synthroid. increased dose to 125 mcg - recommend monitoring thyroid function  * Acute onCKD 3: creatinine 1.6 -> 1.84-> 1.86  *Mild elevation in troponin level,chronic,due to demand ischemia.  *Generalized muscle weakness and unstable gait. PT recommends HHPT DISCHARGE CONDITIONS:  stable CONSULTS OBTAINED:  Treatment Team:  Dalia Heading, MD Mosetta Pigeon, MD DRUG ALLERGIES:  No Known Allergies DISCHARGE MEDICATIONS:   Allergies as of 03/11/2017   No Known Allergies     Medication List    STOP taking these medications   amoxicillin 500 MG  capsule Commonly known as:  AMOXIL   HYDROcodone-acetaminophen 5-325 MG tablet Commonly known as:  NORCO/VICODIN   sulfamethoxazole-trimethoprim 800-160 MG tablet Commonly known as:  BACTRIM DS,SEPTRA DS   XARELTO 20 MG Tabs tablet Generic drug:  rivaroxaban     TAKE these medications   aspirin EC 81 MG tablet Take 81 mg by mouth daily.   carvedilol 3.125 MG tablet Commonly known as:  COREG Take 1 tablet (3.125 mg total) by mouth 2 (two) times daily with a meal.   furosemide 40 MG tablet Commonly known as:  LASIX Take 1 tablet (40 mg total) by mouth 2 (two) times daily. What changed:  when to take this   levothyroxine 125 MCG tablet Commonly known as:  SYNTHROID, LEVOTHROID Take 1 tablet (125 mcg total) by mouth daily before breakfast. What changed:    medication strength  See the new instructions.   lisinopril 10 MG tablet Commonly known as:  PRINIVIL,ZESTRIL Take 10 mg by mouth daily.   tamsulosin 0.4 MG Caps capsule Commonly known as:  FLOMAX Take 1 capsule (0.4 mg total) by mouth daily.        DISCHARGE INSTRUCTIONS:   DIET:  Regular diet DISCHARGE CONDITION:  Good ACTIVITY:  Activity as tolerated OXYGEN:  Home Oxygen: No.  Oxygen Delivery: room air DISCHARGE LOCATION:  home with HHPT  If you experience worsening of your admission symptoms, develop shortness of breath, life threatening emergency, suicidal or homicidal thoughts you must seek medical attention immediately by calling 911 or calling your MD immediately  if  symptoms less severe.  You Must read complete instructions/literature along with all the possible adverse reactions/side effects for all the Medicines you take and that have been prescribed to you. Take any new Medicines after you have completely understood and accpet all the possible adverse reactions/side effects.   Please note  You were cared for by a hospitalist during your hospital stay. If you have any questions about your  discharge medications or the care you received while you were in the hospital after you are discharged, you can call the unit and asked to speak with the hospitalist on call if the hospitalist that took care of you is not available. Once you are discharged, your primary care physician will handle any further medical issues. Please note that NO REFILLS for any discharge medications will be authorized once you are discharged, as it is imperative that you return to your primary care physician (or establish a relationship with a primary care physician if you do not have one) for your aftercare needs so that they can reassess your need for medications and monitor your lab values.    On the day of Discharge:  VITAL SIGNS:  Blood pressure (!) 143/50, pulse 63, temperature 97.9 F (36.6 C), temperature source Oral, resp. rate 18, height 6' (1.829 m), weight 78.9 kg (173 lb 14.4 oz), SpO2 95 %. PHYSICAL EXAMINATION:  GENERAL:  72 y.o.-year-old patient lying in the bed with no acute distress.  EYES: Pupils equal, round, reactive to light and accommodation. No scleral icterus. Extraocular muscles intact.  HEENT: Head atraumatic, normocephalic. Oropharynx and nasopharynx clear.  NECK:  Supple, no jugular venous distention. No thyroid enlargement, no tenderness.  LUNGS: Normal breath sounds bilaterally, no wheezing, rales,rhonchi or crepitation. No use of accessory muscles of respiration.  CARDIOVASCULAR: S1, S2 normal. No murmurs, rubs, or gallops.  ABDOMEN: Soft, non-tender, non-distended. Bowel sounds present. No organomegaly or mass.  EXTREMITIES: No pedal edema, cyanosis, or clubbing.  NEUROLOGIC: Cranial nerves II through XII are intact. Muscle strength 5/5 in all extremities. Sensation intact. Gait not checked.  PSYCHIATRIC: The patient is alert and oriented x 3.  SKIN: No obvious rash, lesion, or ulcer.  DATA REVIEW:   Follow-up Information    [facility]  Follow up on 03/18/2017.   Specialty:  Cardiology Why:  at 9:00am Contact information: [address] [place] [id number] [phone]       Jaclyn Shaggyate, Denny C, MD. Schedule an appointment as soon as possible for a visit in 1 week.   Specialty:  Internal Medicine Why:  PLEASE CALL FRIDAY TO SCHEDULE THIS APPOINTMENT. THE OFFICE CLOSES EARLY ON THURSDAY. Thanks! Contact information: [address]   [place] [address] [phone]        Vanna ScotlandBrandon, Ashley, MD. Go on 03/24/2017.   Specialty:  Urology Why:  Appointment Time: 8:00am with Dr.Stoioff Contact information: [address] [place] [address] [phone]        Mosetta PigeonSingh, Harmeet, MD. Go on 03/25/2017.   Specialty:  Internal Medicine Why:  Appointment Time: 10:00am Contact information: [address] [place] [address] [phone]           Management plans discussed with the patient, family and they are in agreement.  CODE STATUS: Prior   TOTAL TIME TAKING CARE OF THIS PATIENT: 45 minutes.    Delfino LovettVipul Prayan Ulin M.D on 03/26/2017 at 9:30 AM  Between 7am to 6pm - Pager -  [phone]  After 6pm go to [email or web address] - Social research officer, government  [facility]  Office  [phone]  CC: Primary care physician; Jaclyn Shaggy, MD   Note: This dictation was prepared with Dragon dictation along with smaller phrase technology. Any transcriptional errors that result from this process are unintentional.

## 2017-04-07 ENCOUNTER — Ambulatory Visit (INDEPENDENT_AMBULATORY_CARE_PROVIDER_SITE_OTHER): Payer: Medicare HMO | Admitting: Urology

## 2017-04-07 ENCOUNTER — Encounter: Payer: Self-pay | Admitting: Urology

## 2017-04-07 VITALS — BP 147/62 | HR 60 | Ht 71.0 in | Wt 170.5 lb

## 2017-04-07 DIAGNOSIS — R350 Frequency of micturition: Secondary | ICD-10-CM

## 2017-04-07 DIAGNOSIS — N5089 Other specified disorders of the male genital organs: Secondary | ICD-10-CM | POA: Diagnosis not present

## 2017-04-07 MED ORDER — TAMSULOSIN HCL 0.4 MG PO CAPS: 0.4000 mg | ORAL_CAPSULE | Freq: Every day | ORAL | 1 refills | Status: AC

## 2017-04-07 NOTE — Progress Notes (Signed)
04/07/2017 12:28 PM   [name] [name] [birth year] [id number]  Referring provider: Jaclyn Shaggy, MD [address]   [place], [place] [address]  Chief Complaint  Patient presents with  . Hydrocele    HPI: [name] [name] is a 72 year old male seen for evaluation of scrotal swelling.  He was admitted to [facility] on 03/07/2017 for acute respiratory failure due to CHF exacerbation.  He was noted to have significant edema and anasarca.  Furosemide had to be discontinued secondary to worsening renal function but was started at discharge.  Past urologic history remarkable for BPH.  He has previous seen Dr. Evelene Croon and underwent a microwave thermotherapy several years ago.  Since his hospitalization he does complain of nocturia several times per night.  He was placed on tamsulosin and thinks this has helped the problem.   PMH: Past Medical History:  Diagnosis Date  . CHF (congestive heart failure) (HCC)   . Chronic kidney disease   . Cirrhosis (HCC)   . Coronary artery disease   . Hyperlipidemia   . Hypertension   . Hypothyroidism   . 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 Lady Gary thinks I've had 2 that I didn't know about"  . Peripheral vascular disease (HCC) 06/26/11   "blockages"  . Pneumonia 1950-1960's  . Shortness of breath 06/26/11   "all the time for a good while"    Surgical History: Past Surgical History:  Procedure Laterality Date  . CARDIAC CATHETERIZATION  06/24/11  . CORONARY ARTERY BYPASS GRAFT  06/30/2011   Procedure: CORONARY ARTERY BYPASS GRAFTING (CABG);  Surgeon: Loreli Slot, MD;  Location: [facility] OR;  Service: Open Heart Surgery;  Laterality: N/A;  . TONSILLECTOMY     "as a kid"    Home Medications:  Allergies as of 04/07/2017   No Known Allergies     Medication List        Accurate as of 04/07/17 12:28 PM. Always use your most recent med list.          aspirin EC 81 MG tablet Take 81 mg by mouth daily.   atorvastatin 40 MG  tablet Commonly known as:  LIPITOR Take 40 mg by mouth daily.   carvedilol 3.125 MG tablet Commonly known as:  COREG Take 1 tablet (3.125 mg total) by mouth 2 (two) times daily with a meal.   furosemide 40 MG tablet Commonly known as:  LASIX Take 1 tablet (40 mg total) by mouth 2 (two) times daily.   levothyroxine 125 MCG tablet Commonly known as:  SYNTHROID, LEVOTHROID Take 1 tablet (125 mcg total) by mouth daily before breakfast.   lisinopril 10 MG tablet Commonly known as:  PRINIVIL,ZESTRIL Take 10 mg by mouth daily.   tamsulosin 0.4 MG Caps capsule Commonly known as:  FLOMAX Take 1 capsule (0.4 mg total) by mouth daily.       Allergies: No Known Allergies  Family History: Family History  Problem Relation Age of Onset  . Heart disease Mother 44  . Hypertension Brother   . Arrhythmia Brother     Social History:  reports that he has been smoking cigarettes.  He has a 50.00 pack-year smoking history. he has never used smokeless tobacco. He reports that he drinks alcohol. He reports that he does not use drugs.  ROS: UROLOGY Frequent Urination?: No Hard to postpone urination?: No Burning/pain with urination?: No Get up at night to urinate?: Yes Leakage of urine?: No Urine stream starts and stops?:  No Trouble starting stream?: No Do you have to strain to urinate?: No Blood in urine?: No Urinary tract infection?: No Sexually transmitted disease?: No Injury to kidneys or bladder?: No Painful intercourse?: No Weak stream?: No Erection problems?: No Penile pain?: No  Gastrointestinal Nausea?: No Vomiting?: No Indigestion/heartburn?: No Diarrhea?: No Constipation?: Yes  Constitutional Fever: No Night sweats?: No Weight loss?: No Fatigue?: Yes  Skin Skin rash/lesions?: Yes Itching?: Yes  Eyes Blurred vision?: Yes Double vision?: No  Ears/Nose/Throat Sore throat?: No Sinus problems?: No  Hematologic/Lymphatic Swollen glands?: No Easy  bruising?: Yes  Cardiovascular Leg swelling?: Yes Chest pain?: No  Respiratory Cough?: No Shortness of breath?: Yes  Endocrine Excessive thirst?: No  Musculoskeletal Back pain?: No Joint pain?: No  Neurological Headaches?: No Dizziness?: No  Psychologic Depression?: No Anxiety?: No  Physical Exam: BP (!) 147/62 (BP Location: Right Arm, Patient Position: Sitting, Cuff Size: Normal)   Pulse 60   Ht 5\' 11"  (1.803 m)   Wt 170 lb 8 oz (77.3 kg)   BMI 23.78 kg/m   Constitutional:  Alert and oriented, No acute distress. HEENT:  AT, moist mucus membranes.  Trachea midline, no masses. Cardiovascular: No clubbing, cyanosis, or edema. Respiratory: Normal respiratory effort, no increased work of breathing. GI: Abdomen is soft, nontender, nondistended, no abdominal masses GU: No CVA tenderness.  Mild to moderate penile shaft edema.  He states he has been circumcised.  Moderate scrotal swelling.  He may have small to moderate bilateral hydroceles.  Questionable right inguinal hernia. Lymph: No cervical or inguinal lymphadenopathy. Skin: No rashes, bruises or suspicious lesions. Neurologic: Grossly intact, no focal deficits, moving all 4 extremities. Psychiatric: Normal mood and affect.  Laboratory Data: Lab Results  Component Value Date   WBC 5.7 03/11/2017   HGB 11.2 (L) 03/11/2017   HCT 34.5 (L) 03/11/2017   MCV 81.3 03/11/2017   PLT 247 03/11/2017    Lab Results  Component Value Date   CREATININE 1.88 (H) 03/11/2017    Lab Results  Component Value Date   HGBA1C 5.9 (H) 06/26/2011     Assessment & Plan:   72 year old male with anasarca and scrotal/penile edema.  He may have small to moderate bilateral hydroceles.  Will schedule a scrotal sonogram.  Recommend scrotal elevation when he is supine.  His admission CT showed no bladder distention.  His nocturia may be a reflection of his diuresis.  Tamsulosin was refilled.  He will be notified with his ultrasound  results and further recommendations.   Riki AltesScott C Stoioff, MD  [facility] [address], [address] [place], [place] [address] [phone]

## 2017-04-19 ENCOUNTER — Ambulatory Visit
Admission: RE | Admit: 2017-04-19 | Discharge: 2017-04-19 | Disposition: A | Discharge status: Home / Self Care | Payer: Medicare HMO | Source: Ambulatory Visit | Attending: Urology | Admitting: Urology

## 2017-04-19 DIAGNOSIS — N433 Hydrocele, unspecified: Secondary | ICD-10-CM | POA: Insufficient documentation

## 2017-04-19 DIAGNOSIS — N5089 Other specified disorders of the male genital organs: Secondary | ICD-10-CM | POA: Diagnosis not present

## 2017-04-22 ENCOUNTER — Telehealth: Payer: Self-pay

## 2017-04-22 NOTE — Telephone Encounter (Signed)
-----   Message from Riki AltesScott C Stoioff, MD sent at 04/20/2017  5:01 PM EDT ----- Scrotal ultrasound showed no testicular mass.  There is a small calcification adjacent to the left testis which is not concerning.  Small bilateral hydroceles are present.  Would not recommend surgery for his hydroceles.  Recommend a follow-up exam in 2-3 months.

## 2017-04-22 NOTE — Telephone Encounter (Signed)
Spoke with pt in reference to scrotal u/s results. Made aware will need to be seen again in 2-3 months. Pt voiced understanding. Appt made.

## 2017-04-23 ENCOUNTER — Ambulatory Visit: Payer: Medicare Other

## 2017-04-24 ENCOUNTER — Encounter: Payer: Self-pay | Admitting: Certified Registered"

## 2017-04-24 ENCOUNTER — Other Ambulatory Visit: Payer: Self-pay

## 2017-04-24 ENCOUNTER — Inpatient Hospital Stay
Admission: EM | Admit: 2017-04-24 | Discharge: 2017-05-19 | DRG: 951 | Disposition: E | Payer: Medicare HMO | Attending: Internal Medicine | Admitting: Internal Medicine

## 2017-04-24 DIAGNOSIS — K922 Gastrointestinal hemorrhage, unspecified: Secondary | ICD-10-CM | POA: Diagnosis present

## 2017-04-24 DIAGNOSIS — E875 Hyperkalemia: Secondary | ICD-10-CM | POA: Diagnosis present

## 2017-04-24 DIAGNOSIS — N183 Chronic kidney disease, stage 3 (moderate): Secondary | ICD-10-CM | POA: Diagnosis present

## 2017-04-24 DIAGNOSIS — K921 Melena: Secondary | ICD-10-CM | POA: Diagnosis present

## 2017-04-24 DIAGNOSIS — Z7982 Long term (current) use of aspirin: Secondary | ICD-10-CM | POA: Diagnosis not present

## 2017-04-24 DIAGNOSIS — E785 Hyperlipidemia, unspecified: Secondary | ICD-10-CM | POA: Diagnosis present

## 2017-04-24 DIAGNOSIS — I5032 Chronic diastolic (congestive) heart failure: Secondary | ICD-10-CM | POA: Diagnosis present

## 2017-04-24 DIAGNOSIS — Z8701 Personal history of pneumonia (recurrent): Secondary | ICD-10-CM

## 2017-04-24 DIAGNOSIS — I739 Peripheral vascular disease, unspecified: Secondary | ICD-10-CM | POA: Diagnosis present

## 2017-04-24 DIAGNOSIS — Z66 Do not resuscitate: Secondary | ICD-10-CM | POA: Diagnosis present

## 2017-04-24 DIAGNOSIS — I2510 Atherosclerotic heart disease of native coronary artery without angina pectoris: Secondary | ICD-10-CM | POA: Diagnosis present

## 2017-04-24 DIAGNOSIS — N179 Acute kidney failure, unspecified: Secondary | ICD-10-CM | POA: Diagnosis present

## 2017-04-24 DIAGNOSIS — K7460 Unspecified cirrhosis of liver: Secondary | ICD-10-CM | POA: Diagnosis present

## 2017-04-24 DIAGNOSIS — E039 Hypothyroidism, unspecified: Secondary | ICD-10-CM | POA: Diagnosis present

## 2017-04-24 DIAGNOSIS — I130 Hypertensive heart and chronic kidney disease with heart failure and stage 1 through stage 4 chronic kidney disease, or unspecified chronic kidney disease: Secondary | ICD-10-CM | POA: Diagnosis present

## 2017-04-24 DIAGNOSIS — Z951 Presence of aortocoronary bypass graft: Secondary | ICD-10-CM | POA: Diagnosis not present

## 2017-04-24 DIAGNOSIS — Z8249 Family history of ischemic heart disease and other diseases of the circulatory system: Secondary | ICD-10-CM | POA: Diagnosis not present

## 2017-04-24 DIAGNOSIS — I252 Old myocardial infarction: Secondary | ICD-10-CM | POA: Diagnosis not present

## 2017-04-24 DIAGNOSIS — Z515 Encounter for palliative care: Principal | ICD-10-CM | POA: Diagnosis present

## 2017-04-24 DIAGNOSIS — F17210 Nicotine dependence, cigarettes, uncomplicated: Secondary | ICD-10-CM | POA: Diagnosis present

## 2017-04-24 DIAGNOSIS — Z79890 Hormone replacement therapy (postmenopausal): Secondary | ICD-10-CM

## 2017-04-24 LAB — COMPREHENSIVE METABOLIC PANEL
ALBUMIN: 2.4 g/dL — AB (ref 3.5–5.0)
ALT: 17 U/L (ref 17–63)
AST: 54 U/L — AB (ref 15–41)
Alkaline Phosphatase: 261 U/L — ABNORMAL HIGH (ref 38–126)
Anion gap: 19 — ABNORMAL HIGH (ref 5–15)
BUN: 166 mg/dL — AB (ref 6–20)
CALCIUM: 8.1 mg/dL — AB (ref 8.9–10.3)
CHLORIDE: 104 mmol/L (ref 101–111)
CO2: 13 mmol/L — AB (ref 22–32)
CREATININE: 6.81 mg/dL — AB (ref 0.61–1.24)
GFR calc non Af Amer: 7 mL/min — ABNORMAL LOW (ref >60)
GFR, EST AFRICAN AMERICAN: 8 mL/min — AB (ref >60)
GLUCOSE: 111 mg/dL — AB (ref 65–99)
Potassium: 6.4 mmol/L (ref 3.5–5.1)
SODIUM: 136 mmol/L (ref 135–145)
Total Bilirubin: 1 mg/dL (ref 0.3–1.2)
Total Protein: 6.3 g/dL — ABNORMAL LOW (ref 6.5–8.1)

## 2017-04-24 LAB — CBC
HCT: 21.8 % — ABNORMAL LOW (ref 40.0–52.0)
Hemoglobin: 6.8 g/dL — ABNORMAL LOW (ref 13.0–18.0)
MCH: 25 pg — AB (ref 26.0–34.0)
MCHC: 31.3 g/dL — AB (ref 32.0–36.0)
MCV: 79.8 fL — AB (ref 80.0–100.0)
PLATELETS: 387 10*3/uL (ref 150–440)
RBC: 2.73 MIL/uL — AB (ref 4.40–5.90)
RDW: 17.2 % — ABNORMAL HIGH (ref 11.5–14.5)
WBC: 11.4 10*3/uL — ABNORMAL HIGH (ref 3.8–10.6)

## 2017-04-24 LAB — GLUCOSE, CAPILLARY: Glucose-Capillary: 97 mg/dL (ref 65–99)

## 2017-04-24 LAB — TROPONIN I: Troponin I: 1.39 ng/mL (ref <0.03)

## 2017-04-24 LAB — PROTIME-INR
INR: 1.57
PROTHROMBIN TIME: 18.6 s — AB (ref 11.4–15.2)

## 2017-04-24 LAB — LACTIC ACID, PLASMA: Lactic Acid, Venous: 3.8 mmol/L (ref 0.5–1.9)

## 2017-04-24 MED ORDER — SODIUM CHLORIDE 0.9 % IV SOLN
8.0000 mg/h | INTRAVENOUS | Status: DC
  Filled 2017-04-24: qty 80

## 2017-04-24 MED ORDER — MORPHINE SULFATE (PF) 2 MG/ML IV SOLN
2.0000 mg | INTRAVENOUS | Status: DC | PRN
  Administered 2017-04-24 – 2017-04-25 (×7): 2 mg via INTRAVENOUS
  Filled 2017-04-24 (×7): qty 1

## 2017-04-24 MED ORDER — VITAMIN K1 10 MG/ML IJ SOLN
10.0000 mg | Freq: Once | INTRAVENOUS | Status: AC
  Administered 2017-04-24: 10 mg via INTRAVENOUS
  Filled 2017-04-24: qty 1

## 2017-04-24 MED ORDER — SODIUM CHLORIDE 0.9 % IV SOLN: 1.0000 g | Freq: Once | INTRAVENOUS | Status: DC

## 2017-04-24 MED ORDER — SODIUM CHLORIDE 0.9 % IV SOLN
1.0000 g | Freq: Once | INTRAVENOUS | Status: DC
  Filled 2017-04-24: qty 10

## 2017-04-24 MED ORDER — SODIUM CHLORIDE 0.9 % IV SOLN: 10.0000 mL/h | Freq: Once | INTRAVENOUS | Status: DC

## 2017-04-24 MED ORDER — SODIUM CHLORIDE 0.9 % IV SOLN
80.0000 mg | Freq: Once | INTRAVENOUS | Status: DC
  Filled 2017-04-24: qty 80

## 2017-04-24 MED ORDER — SODIUM BICARBONATE 8.4 % IV SOLN: 50.0000 meq | Freq: Once | INTRAVENOUS | Status: DC

## 2017-04-24 MED ORDER — ONDANSETRON HCL 4 MG PO TABS
4.0000 mg | ORAL_TABLET | Freq: Four times a day (QID) | ORAL | Status: DC | PRN
Start: 2017-04-24 — End: 2017-04-25

## 2017-04-24 MED ORDER — SODIUM CHLORIDE 0.9 % IV SOLN: Freq: Once | INTRAVENOUS | Status: DC

## 2017-04-24 MED ORDER — SODIUM CHLORIDE 0.9 % IV BOLUS
500.0000 mL | Freq: Once | INTRAVENOUS | Status: AC
  Administered 2017-04-24: 500 mL via INTRAVENOUS

## 2017-04-24 MED ORDER — SODIUM CHLORIDE 0.9 % IV SOLN
50.0000 ug/h | INTRAVENOUS | Status: DC
  Filled 2017-04-24 (×2): qty 1

## 2017-04-24 MED ORDER — INSULIN ASPART 100 UNIT/ML ~~LOC~~ SOLN: 10.0000 [IU] | Freq: Once | SUBCUTANEOUS | Status: DC

## 2017-04-24 MED ORDER — MORPHINE SULFATE (PF) 2 MG/ML IV SOLN
1.0000 mg | INTRAVENOUS | Status: DC | PRN
  Administered 2017-04-24: 1 mg via INTRAVENOUS
  Filled 2017-04-24: qty 1

## 2017-04-24 MED ORDER — ONDANSETRON HCL 4 MG/2ML IJ SOLN: 4.0000 mg | Freq: Four times a day (QID) | INTRAMUSCULAR | Status: DC | PRN

## 2017-04-24 MED ORDER — LORAZEPAM 2 MG/ML IJ SOLN
0.5000 mg | Freq: Four times a day (QID) | INTRAMUSCULAR | Status: DC | PRN
  Administered 2017-04-24: 17:00:00 0.5 mg via INTRAVENOUS
  Filled 2017-04-24: qty 1

## 2017-04-24 MED ORDER — DEXTROSE 50 % IV SOLN: 50.0000 mL | Freq: Once | INTRAVENOUS | Status: DC

## 2017-04-24 NOTE — ED Notes (Signed)
Bair hugger placed on patient.  Continuous rectal temperature monitoring in place.

## 2017-04-24 NOTE — ED Notes (Signed)
prbc infusion complete.

## 2017-04-24 NOTE — ED Triage Notes (Signed)
Arrives via [facility] with c/o GI bleeding since Monday. EMS reports patient has history of cirrhosis and black tarry stools since Monday.  Patient has history of GI bleeding in the past.  BP per EMS initially 80 systaltically and dropped to 50.  Patient is AAOx3..Marland Kitchen

## 2017-04-24 NOTE — Progress Notes (Signed)
Chaplain responded to page @ 13:16. Chaplain met the patient and then went to the family consult room to meet with Dr. Celedonio Savage and the patient's children Roselyn Reef and [place]).  The doctor was reviewing the patient's medical situation and then led the family into a discussion on next steps.  After discussion, the family agreed on comfort care for the patient, thus honoring their father's desire for a natural death. Chaplain provided emotional support, prayer, and presence.

## 2017-04-24 NOTE — ED Notes (Signed)
[id number]  O positive emergency blood one unit.  Volume 345 ml.  Expiration 05/23/2017.  Unit started at 1218.  Verified by Tonia GhentJane Deondre Marinaro, RN and Gustavus MessingHunter Ore, RN.

## 2017-04-24 NOTE — ED Provider Notes (Addendum)
[facility] Emergency Department Provider Note  ____________________________________________   I have reviewed the triage vital signs and the nursing notes. Where available I have reviewed prior notes and, if possible and indicated, outside hospital notes.    HISTORY  Chief Complaint GI Bleeding    HPI [name] [name] is a 72 y.o. male with a history of cirrhosis, CHF, CAD, who used to be but no longer is on Xarelto, as confirmed by his son, presents today after 3 or 4 days of melena stool.  EMS found him with a blood pressure initially in the 80s then in the 50s.  Patient states that he has had he does not know how many melanotic stools.  He denies significant bowel pain he has had no hematemesis or vomiting.  Patient is a limited code according to family they are getting the most form for me.  Patient states he no longer drinks alcohol but he does have a history of cirrhosis.  Does not know if he has a history of GI bleeds in the past.  EMS was unable to get IV access.  Level 5 chart caveat; no further history available due to patient status.     Past Medical History:  Diagnosis Date  . CHF (congestive heart failure) (HCC)   . Chronic kidney disease   . Cirrhosis (HCC)   . Coronary artery disease   . Hyperlipidemia   . Hypertension   . Hypothyroidism   . 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 Lady Gary thinks I've had 2 that I didn't know about"  . Peripheral vascular disease (HCC) 06/26/11   "blockages"  . Pneumonia 1950-1960's  . Shortness of breath 06/26/11   "all the time for a good while"    Patient Active Problem List   Diagnosis Date Noted  . Scrotal swelling 04/07/2017  . Urinary frequency 04/07/2017  . Lymphedema 03/24/2017  . Chronic systolic heart failure (HCC) 03/18/2017  . PAD (peripheral artery disease) (HCC) 02/23/2017  . CAD (coronary artery disease) 06/26/2011  . Ischemic cardiomyopathy 06/26/2011  . Hyperlipidemia 06/26/2011  .  Hypertension 06/26/2011  . Carotid artery disease (HCC) 06/26/2011  . Tobacco abuse 06/26/2011  . Hypothyroidism 06/26/2011    Past Surgical History:  Procedure Laterality Date  . CARDIAC CATHETERIZATION  06/24/11  . CORONARY ARTERY BYPASS GRAFT  06/30/2011   Procedure: CORONARY ARTERY BYPASS GRAFTING (CABG);  Surgeon: Loreli Slot, MD;  Location: [facility] OR;  Service: Open Heart Surgery;  Laterality: N/A;  . TONSILLECTOMY     "as a kid"    Prior to Admission medications   Medication Sig Start Date End Date Taking? Authorizing Provider  aspirin EC 81 MG tablet Take 81 mg by mouth daily.    [provider]  atorvastatin (LIPITOR) 40 MG tablet Take 40 mg by mouth daily. 03/12/17   [provider]  carvedilol (COREG) 3.125 MG tablet Take 1 tablet (3.125 mg total) by mouth 2 (two) times daily with a meal. 03/11/17   Delfino Lovett, MD  furosemide (LASIX) 40 MG tablet Take 1 tablet (40 mg total) by mouth 2 (two) times daily. 03/11/17   Delfino Lovett, MD  levothyroxine (SYNTHROID, LEVOTHROID) 125 MCG tablet Take 1 tablet (125 mcg total) by mouth daily before breakfast. 03/12/17   Delfino Lovett, MD  lisinopril (PRINIVIL,ZESTRIL) 10 MG tablet Take 10 mg by mouth daily. 12/14/16   [provider]  tamsulosin (FLOMAX) 0.4 MG CAPS capsule Take 1 capsule (0.4 mg total) by  mouth daily. 04/07/17   Stoioff, Verna Czech, MD    Allergies Patient has no known allergies.  Family History  Problem Relation Age of Onset  . Heart disease Mother 40  . Hypertension Brother   . Arrhythmia Brother     Social History Social History   Tobacco Use  . Smoking status: Light Tobacco Smoker    Packs/day: 1.00    Years: 50.00    Pack years: 50.00    Types: Cigarettes    Last attempt to quit: 06/23/2011    Years since quitting: 5.8  . Smokeless tobacco: Never Used  Substance Use Topics  . Alcohol use: Yes    Comment: 06/26/11 "every once in awhile I'd drink a beer; that's about come to an end  too"  . Drug use: No    Review of Systems Constitutional: No fever/chills Eyes: No visual changes. ENT: No sore throat. No stiff neck no neck pain Cardiovascular: Denies chest pain. Respiratory: Denies shortness of breath. Gastrointestinal:   no vomiting.  Positive melanotic stool no constipation. Genitourinary: Negative for dysuria. Musculoskeletal: Negative lower extremity swelling Skin: Negative for rash. Neurological: Negative for severe headaches, focal weakness or numbness.   ____________________________________________   PHYSICAL EXAM:  VITAL SIGNS: ED Triage Vitals  Enc Vitals Group     BP 04/29/2017 1137 (!) 123/39     Pulse Rate 04/22/2017 1137 (!) 56     Resp 05/11/2017 1137 16     Temp 05/05/2017 1156 (!) 94.2 F (34.6 C)     Temp Source 05/01/2017 1156 Oral     SpO2 --      Weight 05/16/2017 1138 170 lb (77.1 kg)     Height 04/29/2017 1138  (1.803 m)     Head Circumference --      Peak Flow --      Pain Score 04/27/2017 1137 0     Pain Loc --      Pain Edu? --      Excl. in GC? --     Constitutional: Weak and alert, chronically and acutely ill-appearing Eyes: Conjunctivae are icteric Head: Atraumatic HEENT: No congestion/rhinnorhea. Mucous membranes are moist.  Oropharynx non-erythematous Neck:   Nontender with no meningismus, no masses, no stridor Cardiovascular: Normal rate, regular rhythm. Grossly normal heart sounds.  Good peripheral circulation. Respiratory: Normal respiratory effort.  No retractions. Lungs slight wheeze in the bases Abdominal: Soft and nontender.  Some cirrhotic appearing distention no guarding no rebound Back:  There is no focal tenderness or step off.  there is no midline tenderness there are no lesions noted. there is no CVA tenderness Rectal exam: Black melanotic guaiac positive stool Musculoskeletal: No lower extremity tenderness, no upper extremity tenderness. No joint effusions, no DVT signs strong distal pulses no  edema Neurologic:  Normal speech and language. No gross focal neurologic deficits are appreciated.  Skin:  Skin is warm, dry and intact. No rash noted.  Somewhat jaundiced Psychiatric: Mood and affect are normal. Speech and behavior are normal.  ____________________________________________   LABS (all labs ordered are listed, but only abnormal results are displayed)  Labs Reviewed  COMPREHENSIVE METABOLIC PANEL  CBC  PROTIME-INR  LACTIC ACID, PLASMA  LACTIC ACID, PLASMA  TROPONIN I  TYPE AND SCREEN  PREPARE RBC (CROSSMATCH)    Pertinent labs  results that were available during my care of the patient were reviewed by me and considered in my medical decision making (see chart for details). ____________________________________________  EKG  I personally  interpreted any EKGs ordered by me or triage Sinus rhythm Prolonged PR interval RBBB and LPFB Repol abnrm suggests ischemia, lateral leads Rate 54 long qtc (late entry) ____________________________________________  RADIOLOGY  Pertinent labs & imaging results that were available during my care of the patient were reviewed by me and considered in my medical decision making (see chart for details). If possible, patient and/or family made aware of any abnormal findings.  No results found. ____________________________________________    PROCEDURES  Procedure(s) performed: None  Procedures  Critical Care performed: CRITICAL CARE Performed by: Jeanmarie Plant   Total critical care time: 55  minutes  Critical care time was exclusive of separately billable procedures and treating other patients.  Critical care was necessary to treat or prevent imminent or life-threatening deterioration.  Critical care was time spent personally by me on the following activities: development of treatment plan with patient and/or surrogate as well as nursing, discussions with consultants, evaluation of patient's response to treatment,  examination of patient, obtaining history from patient or surrogate, ordering and performing treatments and interventions, ordering and review of laboratory studies, ordering and review of radiographic studies, pulse oximetry and re-evaluation of patient's condition.   ____________________________________________   INITIAL IMPRESSION / ASSESSMENT AND PLAN / ED COURSE  Pertinent labs & imaging results that were available during my care of the patient were reviewed by me and considered in my medical decision making (see chart for details).  Patient here with melanotic stools and significant hypotension en route.  Blood pressure initially is in the 120s here for [place] after transfer onto the stretcher however he has a very wide pulse pressure.  Given history of CHF I am reluctant to be aggressive with IV fluids if I think he is going to need blood and I think he is going to need blood.  Patient family consent to blood, we will initiate non-crossmatched transfusion as he has been significantly hypotensive en route and has a most likely upper GI cirrhotic related bleed.  I do not see a documented endoscopy will continue to look for that.  Unknown if he has varices.  Patient will require admission likely to the ICU, will monitor his blood pressure very closely.  We will continue to work him up for his GI bleed  ----------------------------------------- 12:28 PM on 2017/04/30 -----------------------------------------  SPECT 2, hemoglobin 6.8 given that he does have a history of cirrhosis we will give him ceftriaxone, I also ordered Protonix and Protonix drip, we have ordered blood empirically which I think should help him, we are paging GI physician and we will get him admitted.   ----------------------------------------- 12:37 PM on 2017/04/30 -----------------------------------------  With Dr. Norma Fredrickson who agrees with management we are ordering octreotide through the pharmacy have ordered Rocephin, as  well as Protonix, INR is elevated as anticipated, family does confirm he is not on any blood thinners, we will give him vitamin K.  Patient and family state that there is a most form and they are trying to get a copy of it.  The patient and family are kept abreast of all updates.  I have discussed with the hospitalist, they will admit the patient to their service.  They agree with management.  Dr. Allena Katz  ----------------------------------------- 1:14 PM on April 30, 2017 -----------------------------------------  Patient has renal failure at this time creatinine is 6.8 calcium is 6.4, this troponin is elevated, he is in therefore multiorgan system failure I have explained to the family that this is a most likely unsurvivable insult to  the patient.  Especially given his baseline poor physiologic status.  The patient and the son do not wish heroic interventions at this time they agreed the patient should be comfort care, DNR/DNI.  We will therefore forestall ICU transfer at this moment.  Dr. Allena Katz is thoughtfully involved in this conversation and agrees with management.  Appreciate hospitalist input.  ----------------------------------------- 1:37 PM on 05/15/2017 -----------------------------------------  Discussion with son, Lonni Fix, patient and family, patient has expressed to them many times he would not want to be resuscitated does not want to have a medical death he wants a "natural death" to the extent possible.  They feel therefore that we should pull back on our interventions which we will do.  He is in multiorgan system failure.  He will be made comfort measures only.  We will stop the drips.  I will finished his blood transfusion which we have started and at that point, we will allow him what I anticipate to be they will very likely natural death.  The hospitalist, Dr. Allena Katz is in agreement with this plan.  She will take it from here     ____________________________________________   FINAL  CLINICAL IMPRESSION(S) / ED DIAGNOSES  Final diagnoses:  None      This chart was dictated using voice recognition software.  Despite best efforts to proofread,  errors can occur which can change meaning.      Jeanmarie Plant, MD 05/18/2017 1206    Jeanmarie Plant, MD 04/27/2017 1228    Jeanmarie Plant, MD 05/13/2017 1238    Jeanmarie Plant, MD 05/13/2017 1338    Jeanmarie Plant, MD 05/06/17 [phone]

## 2017-04-24 NOTE — H&P (Addendum)
[facility] [place] [facility]   PATIENT NAME: [name] [name]    MR#:  [id number]  DATE OF BIRTH:  07/18/1945  DATE OF ADMISSION:  05/16/2017  PRIMARY CARE PHYSICIAN: Stanton Kidneyoledo, Teodoro K, MD   REQUESTING/REFERRING PHYSICIAN: Dr Alphonzo LemmingsMcshane  CHIEF COMPLAINT:  melena  HISTORY OF PRESENT ILLNESS:  [name] [name]  is a 72 y.o. male with a known history of Cirrhosis,CHF,CAD, hyperlipidemia and hypertension comes to the emergency room with black colored stool. Patient has known history of cirrhosis. He does not drink alcohol he is not on any anticoagulation. He was found to have hemoglobin of 6.8 (hemoglobin and February 2019 was 11.8), potassium of 6.8, creatinine of  7.0 and troponin 1.39. Patient appears to have multiorgan failure. Long family discussion was held by Dr. Alphonzo LemmingsMcshane ER physician and me with son [name] Fixolan and daughter. Both children expressed patient does not want aggressive measures and be on life support. They are at present agreeable for patient being admitted for comfort measures. Patient is a DNR and both the children confirmed. He is currently getting one unit of blood transfusion.   PAST MEDICAL HISTORY:   Past Medical History:  Diagnosis Date  . CHF (congestive heart failure) (HCC)   . Chronic kidney disease   . Cirrhosis (HCC)   . Coronary artery disease   . Hyperlipidemia   . Hypertension   . Hypothyroidism   . 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 Lady GaryFath thinks I've had 2 that I didn't know about"  . Peripheral vascular disease (HCC) 06/26/11   "blockages"  . Pneumonia 1950-1960's  . Shortness of breath 06/26/11   "all the time for a good while"    PAST SURGICAL HISTOIRY:   Past Surgical History:  Procedure Laterality Date  . CARDIAC CATHETERIZATION  06/24/11  . CORONARY ARTERY BYPASS GRAFT  06/30/2011   Procedure: CORONARY ARTERY BYPASS GRAFTING (CABG);  Surgeon: Loreli SlotSteven C Hendrickson, MD;  [facility];  Service: Open Heart  Surgery;  Laterality: N/A;  . TONSILLECTOMY     "as a kid"    SOCIAL HISTORY:   Social History   Tobacco Use  . Smoking status: Light Tobacco Smoker    Packs/day: 1.00    Years: 50.00    Pack years: 50.00    Types: Cigarettes    Last attempt to quit: 06/23/2011    Years since quitting: 5.8  . Smokeless tobacco: Never Used  Substance Use Topics  . Alcohol use: Yes    Comment: 06/26/11 "every once in awhile I'd drink a beer; that's about come to an end too"    FAMILY HISTORY:   Family History  Problem Relation Age of Onset  . Heart disease Mother 5859  . Hypertension Brother   . Arrhythmia Brother     DRUG ALLERGIES:  No Known Allergies  REVIEW OF SYSTEMS:  Review of Systems  Unable to perform ROS: Medical condition     MEDICATIONS AT HOME:   Prior to Admission medications   Medication Sig Start Date End Date Taking? Authorizing Provider  aspirin EC 81 MG tablet Take 81 mg by mouth daily.   Yes [provider]  atorvastatin (LIPITOR) 40 MG tablet Take 40 mg by mouth daily. 03/12/17  Yes [provider]  carvedilol (COREG) 3.125 MG tablet Take 1 tablet (3.125 mg total) by mouth 2 (two) times daily with a meal. 03/11/17  Yes Delfino LovettShah, Vipul, MD  furosemide (LASIX) 40 MG tablet Take 1 tablet (40  mg total) by mouth 2 (two) times daily. 03/11/17  Yes Delfino Lovett, MD  levothyroxine (SYNTHROID, LEVOTHROID) 125 MCG tablet Take 1 tablet (125 mcg total) by mouth daily before breakfast. 03/12/17  Yes Delfino Lovett, MD  lisinopril (PRINIVIL,ZESTRIL) 10 MG tablet Take 10 mg by mouth daily. 12/14/16  Yes [provider]  tamsulosin (FLOMAX) 0.4 MG CAPS capsule Take 1 capsule (0.4 mg total) by mouth daily. 04/07/17  Yes Stoioff, Verna Czech, MD      VITAL SIGNS:  Blood pressure (!) 109/38, pulse (!) 55, temperature (!) 95.4 F (35.2 C), temperature source Rectal, resp. rate 14, height 5\' 11"  (1.803 m), weight 77.1 kg (170 lb), SpO2 100 %.  PHYSICAL EXAMINATION:   GENERAL:  72 y.o.-year-old patient lying in the bed with no acute distress critically ill EYES: Pupils equal, round, reactive to light and accommodation. No scleral icterus. HEENT: Head atraumatic, normocephalic. Oropharynx and nasopharynx clear. Pallor++ NECK:  Supple, no jugular venous distention. No thyroid enlargement, no tenderness.  LUNGS: Normal breath sounds bilaterally, no wheezing, rales,rhonchi or crepitation. No use of accessory muscles of respiration.  CARDIOVASCULAR: S1, S2 normal. No murmurs, rubs, or gallops.  ABDOMEN: Soft, nontender, nondistended. Bowel sounds present. No organomegaly or mass.  EXTREMITIES: No pedal edema, cyanosis, or clubbing.  NEUROLOGIC: unable to examine  PSYCHIATRIC: The patient is alert  SKIN: No obvious rash, lesion, or ulcer.   LABORATORY PANEL:   CBC Recent Labs  Lab 04/26/2017 1141  WBC 11.4*  HGB 6.8*  HCT 21.8*  PLT 387   ------------------------------------------------------------------------------------------------------------------  Chemistries  Recent Labs  Lab 05/13/2017 1141  NA 136  K 6.4*  CL 104  CO2 13*  GLUCOSE 111*  BUN 166*  CREATININE 6.81*  CALCIUM 8.1*  AST 54*  ALT 17  ALKPHOS 261*  BILITOT 1.0   ------------------------------------------------------------------------------------------------------------------  Cardiac Enzymes Recent Labs  Lab 05/12/2017 1141  TROPONINI 1.39*   ------------------------------------------------------------------------------------------------------------------  RADIOLOGY:  No results found.  EKG:    IMPRESSION AND PLAN:   [name] [name]  is a 72 y.o. male with a known history of Cirrhosis,CHF,CAD, hyperlipidemia and hypertension comes to the emergency room with black colored students. Patient has known history of cirrhosis. He does not drink alcohol he is not on any anticoagulation. He was found to have hemoglobin of 6.8 (hemoglobin and February 2019 was 11.8),  potassium of 6.8, creatinine of  7.0 and troponin 01.39. Patient appears to have multiorgan failure.  1. G.I. bleed and patient with known history of cirrhosis of liver -came in with hemoglobin of 6.8 (baseline hemoglobin 11) -pt getting one unit of blood transfusion -continue IV fluids -patient is being admitted for comfort measures only. This was discussed at length with son [name] [name] and daughter in the emergency room along with Dr. Alphonzo Lemmings ER physician -PRN morphine and Ativan  2. acute hyperkalemia secondary to 1.  3. Acute on chronic renal failure Baseline creat 1.8 -came in with creatinine of 7.0 -trickle some IV fluids  4. Elevator troponin at the setting of 1  5. DVT prophylaxis patient on comfort care  Patient care is a very poor prognosis. Family aware. Patient is a DNR.   All the records are reviewed and case discussed with ED provider. Management plans discussed with the patient, family and they are in agreement.  CODE STATUS: dnr  TOTAL critical  TIME TAKING CARE OF THIS PATIENT: 55 minutes.    Enedina Finner M.D on 04/21/2017 at 1:54 PM  Between 7am to 6pm -  Pager - [phone]  After 6pm go to [email or web address] - password [email or web address] [facility]  [facility]  Office  [phone]  CC: Primary care physician; [place], Boykin Nearing, MD

## 2017-04-25 ENCOUNTER — Encounter: Admission: EM | Disposition: E | Payer: Self-pay | Source: Home / Self Care | Attending: Internal Medicine

## 2017-04-25 LAB — BPAM RBC
BLOOD PRODUCT EXPIRATION DATE: 201905052359
Blood Product Expiration Date: 201905052359
ISSUE DATE / TIME: 201904061210
ISSUE DATE / TIME: 201904061210
Unit Type and Rh: 5100
Unit Type and Rh: 5100

## 2017-04-25 LAB — TYPE AND SCREEN
ABO/RH(D): O POS
Antibody Screen: NEGATIVE
UNIT DIVISION: 0
Unit division: 0

## 2017-04-25 LAB — PREPARE RBC (CROSSMATCH)

## 2017-04-25 SURGERY — ESOPHAGOGASTRODUODENOSCOPY (EGD) WITH PROPOFOL
Anesthesia: Monitor Anesthesia Care

## 2017-04-27 ENCOUNTER — Ambulatory Visit: Admission: RE | Admit: 2017-04-27 | Payer: Medicare HMO | Source: Ambulatory Visit

## 2017-04-27 ENCOUNTER — Ambulatory Visit: Payer: Medicare HMO

## 2017-04-29 LAB — CULTURE, BLOOD (ROUTINE X 2)
Culture: NO GROWTH
Special Requests: ADEQUATE

## 2017-05-04 ENCOUNTER — Ambulatory Visit (INDEPENDENT_AMBULATORY_CARE_PROVIDER_SITE_OTHER): Payer: Medicare HMO | Admitting: Vascular Surgery

## 2017-05-06 ENCOUNTER — Ambulatory Visit: Payer: Medicare HMO | Admitting: Urology

## 2017-05-17 ENCOUNTER — Ambulatory Visit: Payer: Commercial Managed Care - HMO | Admitting: Family

## 2017-05-19 NOTE — Progress Notes (Signed)
[facility] - [place] at [facility]   PATIENT NAME: [name] [name]    MR#:  [id number]  DATE OF BIRTH:  1945/10/27  SUBJECTIVE:   Family at bedside patient is on comfort care  REVIEW OF SYSTEMS:  Unable to obtain due to obtunded status        DRUG ALLERGIES:  No Known Allergies  VITALS:  Blood pressure (!) 91/41, pulse (!) 55, temperature (!) 96.7 F (35.9 C), temperature source Axillary, resp. rate 16, height 5\' 11"  (1.803 m), weight 77.1 kg (170 lb), SpO2 98 %.  PHYSICAL EXAMINATION:  Constitutional: Critically ill-appearing with increased respiratory effort       LABORATORY PANEL:   CBC Recent Labs  Lab 11/28/17 1141  WBC 11.4*  HGB 6.8*  HCT 21.8*  PLT 387   ------------------------------------------------------------------------------------------------------------------  Chemistries  Recent Labs  Lab 11/28/17 1141  NA 136  K 6.4*  CL 104  CO2 13*  GLUCOSE 111*  BUN 166*  CREATININE 6.81*  CALCIUM 8.1*  AST 54*  ALT 17  ALKPHOS 261*  BILITOT 1.0   ------------------------------------------------------------------------------------------------------------------  Cardiac Enzymes Recent Labs  Lab 11/28/17 1141  TROPONINI 1.39*   ------------------------------------------------------------------------------------------------------------------  RADIOLOGY:  No results found.   ASSESSMENT AND PLAN:   72 year old male with history of cirrhosis, chronic kidney disease stage III and chronic diastolic heart failure who presented with melena. Due to multiorgan failure family has decided on comfort care   1.  Melena with GI bleed 2.  Hyperkalemia 3.  Acute on chronic kidney failure 4.  Elevated troponin    Management plans discussed with the patient's family at bedside and they are in agreement CODE STATUS: DNR  TOTAL TIME TAKING CARE OF THIS PATIENT: 10 minutes.     Case management consult for hospice  home   Antionette Luster M.D on 05/10/2017 at 10:25 AM  Between 7am to 6pm - Pager - [phone] After 6pm go to [email or web address] - password [email or web address]  [facility] [place] [facility]  Office  [phone]  CC: Primary care physician; Jaclyn Shaggyate, Denny C, MD  Note: This dictation was prepared with Dragon dictation along with smaller phrase technology. Any transcriptional errors that result from this process are unintentional.

## 2017-05-19 NOTE — Discharge Summary (Signed)
DEATH SUMMARY   Patient Details  Name: [name] [name] MRN: [id number] DOB: October 03, 1945  Admission/Discharge Information   Admit Date:  04/26/2017  Date of Death: Date of Death: February 24, 2017  Time of Death: Time of Death: 1303  Length of Stay: 1  Referring Physician: Jaclyn Shaggyate, Denny C, MD   Reason(s) for Hospitalization  gib  Diagnoses  Preliminary cause of death:  Secondary Diagnoses (including complications and co-morbidities):  Active Problems:   GI bleed   Brief Hospital Course (including significant findings, care, treatment, and services provided and events leading to death)  72 year old male with history of cirrhosis, chronic kidney disease stage III and chronic diastolic heart failure who presented with melena. Due to multiorgan failure family has decided on comfort care   1.  Melena with GI bleed 2.  Hyperkalemia 3.  Acute on chronic kidney failure 4.  Elevated troponin       Pertinent Labs and Studies  Significant Diagnostic Studies [place] Scrotum  Result Date: 04/19/2017 CLINICAL DATA:  Scrotal swelling for 2-1/2 months.  No injury. EXAM: ULTRASOUND OF SCROTUM TECHNIQUE: Complete ultrasound examination of the testicles, epididymis, and other scrotal structures was performed. COMPARISON:  None. FINDINGS: Right testicle Measurements: 4.0 x 2.3 x 2.3 cm. No mass or microlithiasis visualized. Left testicle Measurements: 4.4 x 2.4 x 1.9 cm. There is no testicular microlithiasis or mass within the testicle. There is a partially calcified mass adjacent to the left testicle and within the scrotal sac measuring 0.8 x 0.3 x 0.5 cm of unknown significance. Right epididymis:  Normal in size and appearance. Left epididymis:  Normal in size and appearance. Hydrocele:  Bilateral hydrocele is noted. Varicocele:  None visualized. There is skin thickening bilaterally in the scrotum. Skin measures 6.7 mm on the right and 8.7 mm on the left. IMPRESSION: There is a partially calcified mass  within the left scrotal sac separate from the left testicle. This is of unknown significance. This may represent the testicular appendix and may be sequela of prior torsion. Reportedly, the patient has no current pain. Correlate clinically as for the need for urological consultation. Bilateral hydrocele. Electronically Signed   By: Jolaine ClickArthur  Hoss M.D.   On: 04/19/2017 14:27    Microbiology Recent Results (from the past 240 hour(s))  Culture, blood (routine x 2)     Status: None (Preliminary result)   Collection Time: 05/01/2017  1:37 PM  Result Value Ref Range Status   Specimen Description BLOOD RT [facility]  Final   Special Requests   Final    BOTTLES DRAWN AEROBIC AND ANAEROBIC Blood Culture adequate volume   Culture   Final    NO GROWTH 2 DAYS Performed at [facility], [address] [place], [place] [id number]    Report Status PENDING  Incomplete    Lab Basic Metabolic Panel: Recent Labs  Lab 05/12/2017 1141  NA 136  K 6.4*  CL 104  CO2 13*  GLUCOSE 111*  BUN 166*  CREATININE 6.81*  CALCIUM 8.1*   Liver Function Tests: Recent Labs  Lab 04/27/2017 1141  AST 54*  ALT 17  ALKPHOS 261*  BILITOT 1.0  PROT 6.3*  ALBUMIN 2.4*   No results for input(s): LIPASE, AMYLASE in the last 168 hours. No results for input(s): AMMONIA in the last 168 hours. CBC: Recent Labs  Lab 04/22/2017 1141  WBC 11.4*  HGB 6.8*  HCT 21.8*  MCV 79.8*  PLT 387   Cardiac Enzymes: Recent Labs  Lab 05/08/2017 1141  TROPONINI 1.39*   Sepsis Labs: Recent Labs  Lab 05/07/2017 1141  WBC 11.4*  LATICACIDVEN 3.8*    Procedures/Operations  none   [name] 04/26/2017, 10:06 AM

## 2017-05-19 NOTE — Progress Notes (Signed)
Pt has expired at 1303, family was at the bedside emotional support given by staff. Dr. Juliene PinaMody and nursing supervisor were made aware. [facility] was notified and the referral number is [id number].

## 2017-05-19 DEATH — deceased

## 2017-07-07 ENCOUNTER — Ambulatory Visit: Payer: Self-pay | Admitting: Urology

## 2020-01-03 IMAGING — CR DG CHEST 1V
1 series · 1 of 1 positions shown · non-contrast
Comparison: 02/26/2015

CLINICAL DATA: Short of breath

EXAM:
CHEST 1 VIEW

[chest pa]
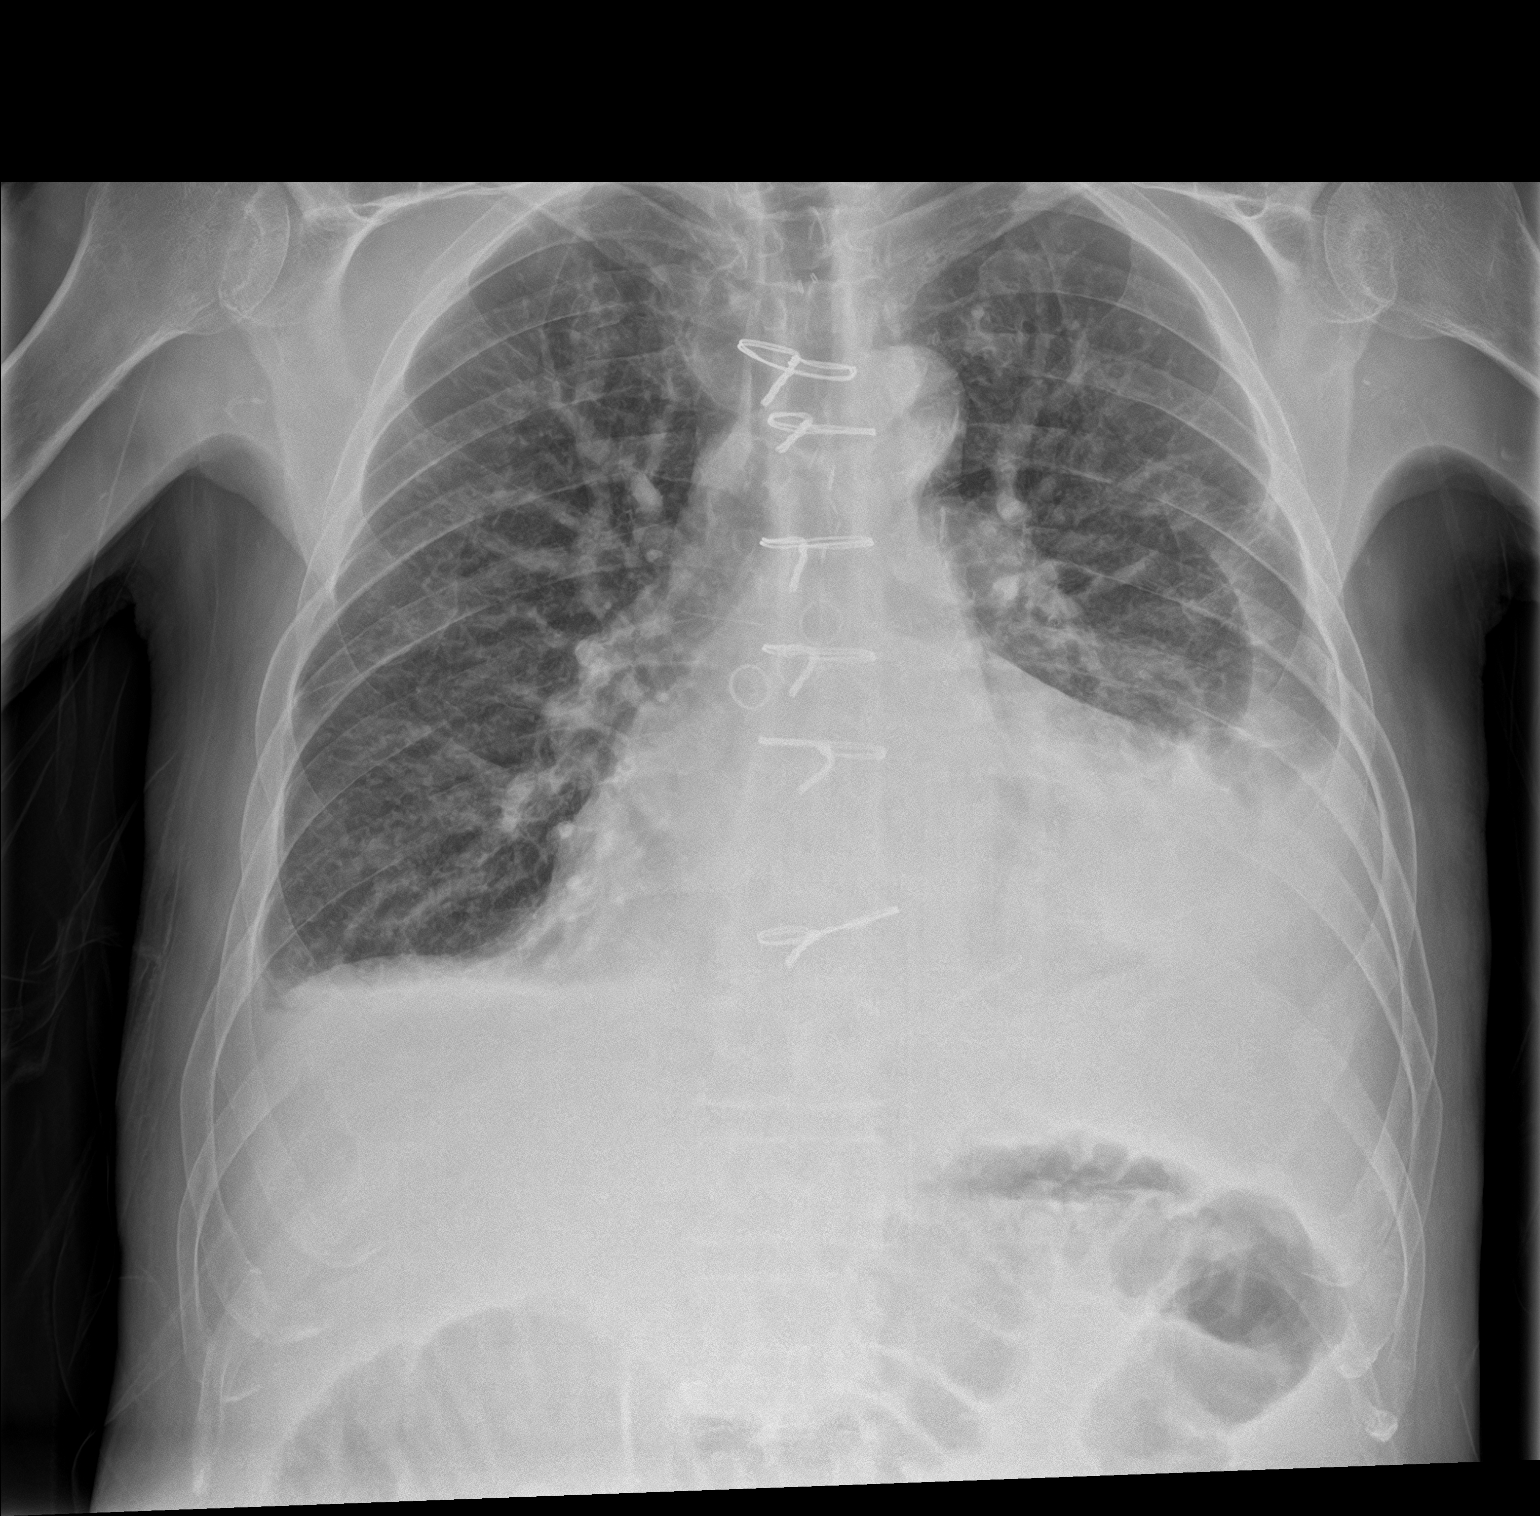

[1 of 1 positions shown; findings below may reference images not displayed]

FINDINGS: Post sternotomy changes. Increased left pleural effusion.
Cardiomegaly with vascular congestion. Development of a small right
pleural effusion. Dense left basilar consolidation. Aortic
atherosclerosis. No pneumothorax.
IMPRESSION: 1. Small bilateral left greater than right pleural effusions
increased compared to prior
2. Cardiomegaly with vascular congestion
3. Increasing atelectasis or pneumonia at the left lung base.
